# Patient Record
Sex: Female | Born: 1945 | Race: White | Hispanic: No | Marital: Married | Smoking: Never smoker
Health system: Southern US, Community
[De-identification: ages and names within clinical notes are randomized; demographics above are authoritative.]

## PROBLEM LIST (undated history)

## (undated) DIAGNOSIS — M1990 Unspecified osteoarthritis, unspecified site: Secondary | ICD-10-CM

## (undated) DIAGNOSIS — R51 Headache: Secondary | ICD-10-CM

## (undated) DIAGNOSIS — R519 Headache, unspecified: Secondary | ICD-10-CM

## (undated) DIAGNOSIS — F419 Anxiety disorder, unspecified: Secondary | ICD-10-CM

## (undated) DIAGNOSIS — L719 Rosacea, unspecified: Secondary | ICD-10-CM

## (undated) DIAGNOSIS — K219 Gastro-esophageal reflux disease without esophagitis: Secondary | ICD-10-CM

## (undated) HISTORY — PX: CATARACT EXTRACTION, BILATERAL: SHX1313

## (undated) HISTORY — PX: DILATION AND CURETTAGE OF UTERUS: SHX78

## (undated) HISTORY — PX: CHOLECYSTECTOMY: SHX55

## (undated) HISTORY — PX: CARPAL TUNNEL RELEASE: SHX101

## (undated) HISTORY — PX: EYE SURGERY: SHX253

## (undated) HISTORY — PX: FOOT SURGERY: SHX648

## (undated) HISTORY — PX: ABDOMINAL HYSTERECTOMY: SHX81

---

## 2018-05-17 ENCOUNTER — Other Ambulatory Visit: Payer: Self-pay | Admitting: Orthopedic Surgery

## 2018-05-17 DIAGNOSIS — M545 Low back pain, unspecified: Secondary | ICD-10-CM

## 2018-05-30 ENCOUNTER — Other Ambulatory Visit: Payer: Self-pay

## 2018-06-03 ENCOUNTER — Ambulatory Visit
Admission: RE | Admit: 2018-06-03 | Discharge: 2018-06-03 | Disposition: A | Discharge status: Home / Self Care | Payer: Medicare Other | Source: Ambulatory Visit | Attending: Orthopedic Surgery | Admitting: Orthopedic Surgery

## 2018-06-03 DIAGNOSIS — M545 Low back pain, unspecified: Secondary | ICD-10-CM

## 2018-06-14 DIAGNOSIS — E785 Hyperlipidemia, unspecified: Secondary | ICD-10-CM | POA: Diagnosis present

## 2018-06-14 DIAGNOSIS — M1711 Unilateral primary osteoarthritis, right knee: Secondary | ICD-10-CM | POA: Diagnosis present

## 2018-06-14 DIAGNOSIS — F411 Generalized anxiety disorder: Secondary | ICD-10-CM | POA: Diagnosis present

## 2018-06-14 NOTE — H&P (Signed)
KNEE ARTHROPLASTY ADMISSION H&P  Patient ID: Alicia Reyes MRN: [id number] DOB/AGE: 06-23-1945 73 y.o.  Chief Complaint: right knee pain.  Planned Procedure Date: 06/25/18 Medical and Cardiac Clearance by Zollie Peeon Bulla, PA-C    HPI: Alicia BaptistJohnnie Turan is a 73 y.o. female with a history of anxiety, hyperlipidemia, and UTI after 2 relatively recent surgical procedures who presents for evaluation of OA RIGHT KNEE. The patient has a history of pain and functional disability in the right knee due to arthritis and has failed non-surgical conservative treatments for greater than 12 weeks to include NSAID's and/or analgesics, corticosteriod injections, use of assistive devices and activity modification.  Onset of symptoms was gradual, starting about 6 months ago with gradually worsening course since that time.  Patient currently rates pain at 8 out of 10 with activity. Patient has night pain, worsening of pain with activity and weight bearing and pain that interferes with activities of daily living.  Patient has periarticular osteophytes and joint space narrowing on XR.  MRI shows severe medial OA and meniscus tear.  No significant lateral disease.  There is no active infection.  Past Medical History: Anxiety, Hyperlipidemia  Past Surgical History: Cholecystectomy 11/2017.  Neuroma right foot and bone spur left foot 2007.  Retina surgery 01/2017.  Cataract surgery 2014.  Carpal tunnel surgery 1985.  Hysterectomy 1982.  Allergies: Codeine: itching, hallucination w/ high doses Percocet: Sensitive - caused unsteadiness  Latex: itching / rash  Medications: Rosuvastatin 5 mg 3x per week Lorazepam 1 mg PRN  Social History: Married Airline pilotaccountant.  Non-smoker no alcohol use.  Family History: Mother with heart disease, MI, hypertension, CVA, cancer.  Father with DM.  Sister with heart disease, MI, hypertension, cancer.  ROS: Currently denies lightheadedness, dizziness, Fever, chills, CP, SOB.   No  personal history of DVT, PE, MI, or CVA. No loose teeth or dentures.  She wears glasses.  Intermittent trouble swallowing and a history of esophagitis/gastritis/ulcer after a dental procedure when she swallowed local anesthetic. All other systems have been reviewed and were otherwise currently negative with the exception of those mentioned in the HPI and as above.  Objective: Vitals: Ht: 5 feet 3 and half inches wt: 165 temp: 97.9 BP: 136/85 pulse: 79 O2 99 % on room air.   Physical Exam: General: Alert, NAD.  Antalgic Gait.  Uses a rolling walker. HEENT: EOMI, Good Neck Extension  Pulm: No increased work of breathing.  Clear B/L A/P w/o crackle or wheeze.  CV: RRR, No m/g/r appreciated  GI: soft, NT, ND Neuro: Neuro without gross focal deficit.  Sensation intact distally Skin: No lesions in the area of chief complaint MSK/Surgical Site: Right knee w/o redness or effusion.  Medial JLT. ROM 0-115.  5/5 strength in extension and flexion.  +EHL/FHL.  NVI.  Stable varus and valgus stress.    Imaging Review Plain radiographs and MRI demonstrate severe degenerative joint disease and medial meniscus tear of the right knee.   Preoperative templating of the joint replacement has been completed, documented, and submitted to the Operating Room personnel in order to optimize intra-operative equipment management.  Assessment: OA RIGHT KNEE Principal Problem:   Primary osteoarthritis of right knee Active Problems:   Anxiety state   Hyperlipidemia   Plan: Plan for Procedure(s): UNICOMPARTMENTAL KNEE  The patient history, physical exam, clinical judgement of the provider and imaging are consistent with end stage degenerative joint disease and unicompartmental joint arthroplasty is deemed medically necessary. The treatment options including medical management, injection therapy, and  arthroplasty were discussed at length. The risks and benefits of Procedure(s): UNICOMPARTMENTAL KNEE were presented  and reviewed.  The risks of nonoperative treatment, versus surgical intervention including but not limited to continued pain, aseptic loosening, stiffness, dislocation/subluxation, infection, bleeding, nerve injury, blood clots, cardiopulmonary complications, morbidity, mortality, among others were discussed. The patient verbalizes understanding and wishes to proceed with the plan.  Patient is being admitted for inpatient treatment for surgery, pain control, PT, OT, prophylactic antibiotics, VTE prophylaxis, progressive ambulation, ADL's and discharge planning.   Dental prophylaxis discussed and recommended for 2 years postoperatively.   The patient does meet the criteria for TXA which will be used perioperatively.    ASA 81 mg BID will be used postoperatively for DVT prophylaxis in addition to SCDs, and early ambulation.  Plan for Norco and Gabapentin for pain.  Celebrex and omeprazole for pain/inflammation and gastric protection.  She has minimal anterolisthesis of L5 on S1 secondary to Bilateral L5 pars defects and mild broad based disc bulge L2-S1 on MRI - currently asymptomatic.  Chronic great toe pain w/ previous OA dx.  No gout history, but will check Uric acid dt recent pain.  The patient is planning to be discharged with OPPT in care of her husband, sister-in-law, and her husband.   Patient's anticipated LOS is less than 2 midnights, meeting these requirements: - Lives within 1 hour of care - Has a competent adult at home to recover with post-op recover - NO history of  - Chronic pain requiring opiods  - Diabetes  - Coronary Artery Disease  - Heart failure  - Heart attack  - Stroke  - DVT/VTE  - Cardiac arrhythmia  - Respiratory Failure/COPD  - Renal failure  - Anemia  - Advanced Liver disease   Albina Billet III, PA-C 06/14/2018 2:27 PM

## 2018-06-18 ENCOUNTER — Encounter (HOSPITAL_COMMUNITY): Payer: Self-pay

## 2018-06-18 ENCOUNTER — Other Ambulatory Visit: Payer: Self-pay

## 2018-06-18 ENCOUNTER — Encounter (INDEPENDENT_AMBULATORY_CARE_PROVIDER_SITE_OTHER): Payer: Self-pay

## 2018-06-18 ENCOUNTER — Encounter (HOSPITAL_COMMUNITY)
Admission: RE | Admit: 2018-06-18 | Discharge: 2018-06-18 | Disposition: A | Discharge status: Home / Self Care | Payer: Medicare Other | Source: Ambulatory Visit | Attending: Orthopedic Surgery | Admitting: Orthopedic Surgery

## 2018-06-18 DIAGNOSIS — E785 Hyperlipidemia, unspecified: Secondary | ICD-10-CM | POA: Diagnosis not present

## 2018-06-18 DIAGNOSIS — F419 Anxiety disorder, unspecified: Secondary | ICD-10-CM | POA: Insufficient documentation

## 2018-06-18 DIAGNOSIS — Z01812 Encounter for preprocedural laboratory examination: Secondary | ICD-10-CM | POA: Diagnosis not present

## 2018-06-18 DIAGNOSIS — M1711 Unilateral primary osteoarthritis, right knee: Secondary | ICD-10-CM | POA: Insufficient documentation

## 2018-06-18 HISTORY — DX: Rosacea, unspecified: L71.9

## 2018-06-18 HISTORY — DX: Anxiety disorder, unspecified: F41.9

## 2018-06-18 HISTORY — DX: Gastro-esophageal reflux disease without esophagitis: K21.9

## 2018-06-18 HISTORY — DX: Headache, unspecified: R51.9

## 2018-06-18 HISTORY — DX: Unspecified osteoarthritis, unspecified site: M19.90

## 2018-06-18 HISTORY — DX: Headache: R51

## 2018-06-18 LAB — CBC
HCT: 42.5 % (ref 36.0–46.0)
HEMOGLOBIN: 13.5 g/dL (ref 12.0–15.0)
MCH: 29.6 pg (ref 26.0–34.0)
MCHC: 31.8 g/dL (ref 30.0–36.0)
MCV: 93.2 fL (ref 80.0–100.0)
Platelets: 260 10*3/uL (ref 150–400)
RBC: 4.56 MIL/uL (ref 3.87–5.11)
RDW: 13.5 % (ref 11.5–15.5)
WBC: 4.3 10*3/uL (ref 4.0–10.5)
nRBC: 0 % (ref 0.0–0.2)

## 2018-06-18 LAB — URINALYSIS, ROUTINE W REFLEX MICROSCOPIC
Bilirubin Urine: NEGATIVE
Glucose, UA: NEGATIVE mg/dL
Hgb urine dipstick: NEGATIVE
Ketones, ur: 20 mg/dL — AB
Nitrite: NEGATIVE
Protein, ur: NEGATIVE mg/dL
Specific Gravity, Urine: 1.016 (ref 1.005–1.030)
pH: 5 (ref 5.0–8.0)

## 2018-06-18 LAB — SURGICAL PCR SCREEN
MRSA, PCR: POSITIVE — AB
Staphylococcus aureus: POSITIVE — AB

## 2018-06-18 LAB — BASIC METABOLIC PANEL
Anion gap: 10 (ref 5–15)
BUN: 10 mg/dL (ref 8–23)
CO2: 27 mmol/L (ref 22–32)
Calcium: 9.6 mg/dL (ref 8.9–10.3)
Chloride: 106 mmol/L (ref 98–111)
Creatinine, Ser: 0.6 mg/dL (ref 0.44–1.00)
GFR calc Af Amer: >60 mL/min (ref >60)
GFR calc non Af Amer: >60 mL/min (ref >60)
Glucose, Bld: 98 mg/dL (ref 70–99)
Potassium: 4.5 mmol/L (ref 3.5–5.1)
SODIUM: 143 mmol/L (ref 135–145)

## 2018-06-18 LAB — URIC ACID: Uric Acid, Serum: 4.3 mg/dL (ref 2.5–7.1)

## 2018-06-18 NOTE — Patient Instructions (Addendum)
Alicia Reyes  06/18/2018   Your procedure is scheduled on: Tuesday 06/25/2018  Report to [facility] Main  Entrance              Report to admitting at   1010 AM    Call this number if you have problems the morning of surgery [phone]    Remember: Do not eat food or drink liquids :After Midnight.               BRUSH YOUR TEETH MORNING OF SURGERY AND RINSE YOUR MOUTH OUT, NO CHEWING GUM CANDY OR MINTS.     Take these medicines the morning of surgery with A SIP OF WATER: Zyrtec if needed,  use Eye drops                                You may not have any metal on your body including hair pins and              piercings  Do not wear jewelry, make-up, lotions, powders or perfumes, deodorant                         Men may shave face and neck.   Do not bring valuables to the hospital. [facility] IS NOT             RESPONSIBLE   FOR VALUABLES.  Contacts, dentures or bridgework may not be worn into surgery.  Leave suitcase in the car. After surgery it may be brought to your room.                  Please read over the following fact sheets you were given: _____________________________________________________________________             [facility] - Preparing for Surgery Before surgery, you can play an important role.  Because skin is not sterile, your skin needs to be as free of germs as possible.  You can reduce the number of germs on your skin by washing with CHG (chlorahexidine gluconate) soap before surgery.  CHG is an antiseptic cleaner which kills germs and bonds with the skin to continue killing germs even after washing. Please DO NOT use if you have an allergy to CHG or antibacterial soaps.  If your skin becomes reddened/irritated stop using the CHG and inform your nurse when you arrive at Short Stay. Do not shave (including legs and underarms) for at least 48 hours prior to the first CHG shower.  You may shave your face/neck. Please follow  these instructions carefully:  1.  Shower with CHG Soap the night before surgery and the  morning of Surgery.  2.  If you choose to wash your hair, wash your hair first as usual with your  normal  shampoo.  3.  After you shampoo, rinse your hair and body thoroughly to remove the  shampoo.                           4.  Use CHG as you would any other liquid soap.  You can apply chg directly  to the skin and wash                       Gently with a  scrungie or clean washcloth.  5.  Apply the CHG Soap to your body ONLY FROM THE NECK DOWN.   Do not use on face/ open                           Wound or open sores. Avoid contact with eyes, ears mouth and genitals (private parts).                       Wash face,  Genitals (private parts) with your normal soap.             6.  Wash thoroughly, paying special attention to the area where your surgery  will be performed.  7.  Thoroughly rinse your body with warm water from the neck down.  8.  DO NOT shower/wash with your normal soap after using and rinsing off  the CHG Soap.                9.  Pat yourself dry with a clean towel.            10.  Wear clean pajamas.            11.  Place clean sheets on your bed the night of your first shower and do not  sleep with pets. Day of Surgery : Do not apply any lotions/deodorants the morning of surgery.  Please wear clean clothes to the hospital/surgery center.  FAILURE TO FOLLOW THESE INSTRUCTIONS MAY RESULT IN THE CANCELLATION OF YOUR SURGERY PATIENT SIGNATURE_________________________________  NURSE SIGNATURE__________________________________  ________________________________________________________________________   Adam Phenix  An incentive spirometer is a tool that can help keep your lungs clear and active. This tool measures how well you are filling your lungs with each breath. Taking long deep breaths may help reverse or decrease the chance of developing breathing (pulmonary) problems  (especially infection) following:  A long period of time when you are unable to move or be active. BEFORE THE PROCEDURE   If the spirometer includes an indicator to show your best effort, your nurse or respiratory therapist will set it to a desired goal.  If possible, sit up straight or lean slightly forward. Try not to slouch.  Hold the incentive spirometer in an upright position. INSTRUCTIONS FOR USE  1. Sit on the edge of your bed if possible, or sit up as far as you can in bed or on a chair. 2. Hold the incentive spirometer in an upright position. 3. Breathe out normally. 4. Place the mouthpiece in your mouth and seal your lips tightly around it. 5. Breathe in slowly and as deeply as possible, raising the piston or the ball toward the top of the column. 6. Hold your breath for 3-5 seconds or for as long as possible. Allow the piston or ball to fall to the bottom of the column. 7. Remove the mouthpiece from your mouth and breathe out normally. 8. Rest for a few seconds and repeat Steps 1 through 7 at least 10 times every 1-2 hours when you are awake. Take your time and take a few normal breaths between deep breaths. 9. The spirometer may include an indicator to show your best effort. Use the indicator as a goal to work toward during each repetition. 10. After each set of 10 deep breaths, practice coughing to be sure your lungs are clear. If you have an incision (the cut made at the time of surgery), support your incision  when coughing by placing a pillow or rolled up towels firmly against it. Once you are able to get out of bed, walk around indoors and cough well. You may stop using the incentive spirometer when instructed by your caregiver.  RISKS AND COMPLICATIONS  Take your time so you do not get dizzy or light-headed.  If you are in pain, you may need to take or ask for pain medication before doing incentive spirometry. It is harder to take a deep breath if you are having  pain. AFTER USE  Rest and breathe slowly and easily.  It can be helpful to keep track of a log of your progress. Your caregiver can provide you with a simple table to help with this. If you are using the spirometer at home, follow these instructions: [place] IF:   You are having difficultly using the spirometer.  You have trouble using the spirometer as often as instructed.  Your pain medication is not giving enough relief while using the spirometer.  You develop fever of 100.5 F (38.1 C) or higher. SEEK IMMEDIATE MEDICAL CARE IF:   You cough up bloody sputum that had not been present before.  You develop fever of 102 F (38.9 C) or greater.  You develop worsening pain at or near the incision site. MAKE SURE YOU:   Understand these instructions.  Will watch your condition.  Will get help right away if you are not doing well or get worse. Document Released: 10/09/2006 Document Revised: 08/21/2011 Document Reviewed: 12/10/2006 [facility] Patient Information 2014 [place], [place].   ________________________________________________________________________

## 2018-06-21 ENCOUNTER — Other Ambulatory Visit: Payer: Self-pay | Admitting: Orthopedic Surgery

## 2018-06-21 NOTE — Care Plan (Signed)
Multiple calls with patient prior to surgery. She plans to discharge to home with family and go to OPPT. It has been arranged at [facility] OPPT on [address] at her request.  Appointment time is 06/28/18 @ 1000 Equipment ordered from [employer].   Shauna Hugh, RNCM  [phone]

## 2018-06-24 MED ORDER — BUPIVACAINE LIPOSOME 1.3 % IJ SUSP
20.0000 mL | INTRAMUSCULAR | Status: AC
  Filled 2018-06-24: qty 20

## 2018-06-25 ENCOUNTER — Inpatient Hospital Stay (HOSPITAL_COMMUNITY): Payer: Medicare Other | Admitting: Physician Assistant

## 2018-06-25 ENCOUNTER — Encounter (HOSPITAL_COMMUNITY): Payer: Self-pay | Admitting: Anesthesiology

## 2018-06-25 ENCOUNTER — Encounter (HOSPITAL_COMMUNITY)
Admission: RE | Disposition: A | Discharge status: Home Health | Payer: Self-pay | Source: Home / Self Care | Attending: Orthopedic Surgery

## 2018-06-25 ENCOUNTER — Other Ambulatory Visit: Payer: Self-pay

## 2018-06-25 ENCOUNTER — Observation Stay (HOSPITAL_COMMUNITY)
Admission: RE | Admit: 2018-06-25 | Discharge: 2018-06-26 | Disposition: A | Discharge status: Home Health | Payer: Medicare Other | Attending: Orthopedic Surgery | Admitting: Orthopedic Surgery

## 2018-06-25 ENCOUNTER — Observation Stay (HOSPITAL_COMMUNITY): Payer: Medicare Other

## 2018-06-25 ENCOUNTER — Inpatient Hospital Stay (HOSPITAL_COMMUNITY): Payer: Medicare Other | Admitting: Certified Registered Nurse Anesthetist

## 2018-06-25 DIAGNOSIS — M1711 Unilateral primary osteoarthritis, right knee: Secondary | ICD-10-CM | POA: Diagnosis not present

## 2018-06-25 DIAGNOSIS — K219 Gastro-esophageal reflux disease without esophagitis: Secondary | ICD-10-CM | POA: Insufficient documentation

## 2018-06-25 DIAGNOSIS — E785 Hyperlipidemia, unspecified: Secondary | ICD-10-CM | POA: Diagnosis not present

## 2018-06-25 DIAGNOSIS — Z7984 Long term (current) use of oral hypoglycemic drugs: Secondary | ICD-10-CM | POA: Diagnosis not present

## 2018-06-25 DIAGNOSIS — I2510 Atherosclerotic heart disease of native coronary artery without angina pectoris: Secondary | ICD-10-CM | POA: Insufficient documentation

## 2018-06-25 DIAGNOSIS — M1710 Unilateral primary osteoarthritis, unspecified knee: Secondary | ICD-10-CM | POA: Diagnosis present

## 2018-06-25 DIAGNOSIS — D649 Anemia, unspecified: Secondary | ICD-10-CM | POA: Diagnosis not present

## 2018-06-25 DIAGNOSIS — Z96659 Presence of unspecified artificial knee joint: Secondary | ICD-10-CM

## 2018-06-25 DIAGNOSIS — I509 Heart failure, unspecified: Secondary | ICD-10-CM | POA: Diagnosis not present

## 2018-06-25 DIAGNOSIS — J449 Chronic obstructive pulmonary disease, unspecified: Secondary | ICD-10-CM | POA: Diagnosis not present

## 2018-06-25 DIAGNOSIS — Z885 Allergy status to narcotic agent status: Secondary | ICD-10-CM | POA: Insufficient documentation

## 2018-06-25 DIAGNOSIS — E119 Type 2 diabetes mellitus without complications: Secondary | ICD-10-CM | POA: Insufficient documentation

## 2018-06-25 DIAGNOSIS — Z79899 Other long term (current) drug therapy: Secondary | ICD-10-CM | POA: Diagnosis not present

## 2018-06-25 DIAGNOSIS — F411 Generalized anxiety disorder: Secondary | ICD-10-CM | POA: Diagnosis present

## 2018-06-25 HISTORY — PX: PARTIAL KNEE ARTHROPLASTY: SHX2174

## 2018-06-25 SURGERY — ARTHROPLASTY, KNEE, UNICOMPARTMENTAL
Anesthesia: Spinal | Site: Knee | Laterality: Right

## 2018-06-25 MED ORDER — LIDOCAINE 2% (20 MG/ML) 5 ML SYRINGE
INTRAMUSCULAR | Status: DC | PRN
  Administered 2018-06-25: 40 mg via INTRAVENOUS

## 2018-06-25 MED ORDER — POLYVINYL ALCOHOL 1.4 % OP SOLN
1.0000 [drp] | Freq: Every day | OPHTHALMIC | Status: DC | PRN
  Filled 2018-06-25: qty 15

## 2018-06-25 MED ORDER — PROPOFOL 500 MG/50ML IV EMUL
INTRAVENOUS | Status: DC | PRN
  Administered 2018-06-25: 75 ug/kg/min via INTRAVENOUS

## 2018-06-25 MED ORDER — ONDANSETRON HCL 4 MG/2ML IJ SOLN
INTRAMUSCULAR | Status: DC | PRN
  Administered 2018-06-25: 4 mg via INTRAVENOUS

## 2018-06-25 MED ORDER — SORBITOL 70 % SOLN
30.0000 mL | Freq: Every day | Status: DC | PRN
  Filled 2018-06-25: qty 30

## 2018-06-25 MED ORDER — FENTANYL CITRATE (PF) 100 MCG/2ML IJ SOLN: 25.0000 ug | INTRAMUSCULAR | Status: DC | PRN

## 2018-06-25 MED ORDER — PANTOPRAZOLE SODIUM 40 MG PO TBEC
40.0000 mg | DELAYED_RELEASE_TABLET | Freq: Every day | ORAL | Status: DC
  Administered 2018-06-25 – 2018-06-26 (×2): 40 mg via ORAL
  Filled 2018-06-25 (×2): qty 1

## 2018-06-25 MED ORDER — OXYCODONE HCL 5 MG PO TABS: 5.0000 mg | ORAL_TABLET | Freq: Once | ORAL | Status: DC | PRN

## 2018-06-25 MED ORDER — POVIDONE-IODINE 10 % EX SWAB
2.0000 "application " | Freq: Once | CUTANEOUS | Status: AC
  Administered 2018-06-25: 2 via TOPICAL

## 2018-06-25 MED ORDER — PROPOFOL 10 MG/ML IV BOLUS
INTRAVENOUS | Status: DC | PRN
  Administered 2018-06-25: 10 mg via INTRAVENOUS

## 2018-06-25 MED ORDER — MENTHOL 3 MG MT LOZG: 1.0000 | LOZENGE | OROMUCOSAL | Status: DC | PRN

## 2018-06-25 MED ORDER — FENTANYL CITRATE (PF) 100 MCG/2ML IJ SOLN
50.0000 ug | Freq: Once | INTRAMUSCULAR | Status: DC
  Filled 2018-06-25: qty 2

## 2018-06-25 MED ORDER — MAGNESIUM CITRATE PO SOLN: 1.0000 | Freq: Once | ORAL | Status: DC | PRN

## 2018-06-25 MED ORDER — METHOCARBAMOL 500 MG PO TABS: 500.0000 mg | ORAL_TABLET | Freq: Four times a day (QID) | ORAL | Status: DC | PRN

## 2018-06-25 MED ORDER — ACETAMINOPHEN 325 MG PO TABS
325.0000 mg | ORAL_TABLET | Freq: Four times a day (QID) | ORAL | Status: DC | PRN
  Filled 2018-06-25: qty 2

## 2018-06-25 MED ORDER — PROPOFOL 10 MG/ML IV BOLUS
INTRAVENOUS | Status: AC
  Filled 2018-06-25: qty 20

## 2018-06-25 MED ORDER — METOCLOPRAMIDE HCL 5 MG/ML IJ SOLN: 5.0000 mg | Freq: Three times a day (TID) | INTRAMUSCULAR | Status: DC | PRN

## 2018-06-25 MED ORDER — ACETAMINOPHEN 10 MG/ML IV SOLN: 1000.0000 mg | Freq: Once | INTRAVENOUS | Status: DC | PRN

## 2018-06-25 MED ORDER — CEFAZOLIN SODIUM-DEXTROSE 2-4 GM/100ML-% IV SOLN
2.0000 g | INTRAVENOUS | Status: AC
  Administered 2018-06-25: 2 g via INTRAVENOUS
  Filled 2018-06-25: qty 100

## 2018-06-25 MED ORDER — MIDAZOLAM HCL 2 MG/2ML IJ SOLN
1.0000 mg | Freq: Once | INTRAMUSCULAR | Status: DC
  Filled 2018-06-25: qty 2

## 2018-06-25 MED ORDER — CELECOXIB 200 MG PO CAPS
200.0000 mg | ORAL_CAPSULE | Freq: Two times a day (BID) | ORAL | Status: DC
  Administered 2018-06-25 – 2018-06-26 (×2): 200 mg via ORAL
  Filled 2018-06-25 (×2): qty 1

## 2018-06-25 MED ORDER — HYDROCODONE-ACETAMINOPHEN 5-325 MG PO TABS
1.0000 | ORAL_TABLET | ORAL | Status: DC | PRN
  Administered 2018-06-25 – 2018-06-26 (×2): 1 via ORAL
  Filled 2018-06-25 (×2): qty 1
  Filled 2018-06-25: qty 2

## 2018-06-25 MED ORDER — MEPERIDINE HCL 50 MG/ML IJ SOLN
INTRAMUSCULAR | Status: AC
  Filled 2018-06-25: qty 1

## 2018-06-25 MED ORDER — SODIUM CHLORIDE 0.9 % IV SOLN
INTRAVENOUS | Status: DC | PRN
  Administered 2018-06-25: 40 ug/min via INTRAVENOUS

## 2018-06-25 MED ORDER — OXYCODONE HCL 5 MG/5ML PO SOLN
5.0000 mg | Freq: Once | ORAL | Status: DC | PRN
  Filled 2018-06-25: qty 5

## 2018-06-25 MED ORDER — VANCOMYCIN HCL IN DEXTROSE 1-5 GM/200ML-% IV SOLN
1000.0000 mg | Freq: Once | INTRAVENOUS | Status: AC
  Administered 2018-06-25: 1000 mg via INTRAVENOUS
  Filled 2018-06-25: qty 200

## 2018-06-25 MED ORDER — LORATADINE 10 MG PO TABS
10.0000 mg | ORAL_TABLET | Freq: Every day | ORAL | Status: DC
  Administered 2018-06-26: 10 mg via ORAL
  Filled 2018-06-25: qty 1

## 2018-06-25 MED ORDER — ONDANSETRON HCL 4 MG PO TABS: 4.0000 mg | ORAL_TABLET | Freq: Four times a day (QID) | ORAL | Status: DC | PRN

## 2018-06-25 MED ORDER — ASPIRIN EC 81 MG PO TBEC: 81.0000 mg | DELAYED_RELEASE_TABLET | Freq: Two times a day (BID) | ORAL | 0 refills | Status: DC

## 2018-06-25 MED ORDER — HYDROCODONE-ACETAMINOPHEN 5-325 MG PO TABS: 1.0000 | ORAL_TABLET | Freq: Four times a day (QID) | ORAL | 0 refills | Status: AC | PRN

## 2018-06-25 MED ORDER — OMEPRAZOLE 20 MG PO CPDR: 20.0000 mg | DELAYED_RELEASE_CAPSULE | Freq: Every day | ORAL | 0 refills | Status: AC

## 2018-06-25 MED ORDER — ONDANSETRON HCL 4 MG PO TABS: 4.0000 mg | ORAL_TABLET | Freq: Three times a day (TID) | ORAL | 0 refills | Status: AC | PRN

## 2018-06-25 MED ORDER — PHENOL 1.4 % MT LIQD
1.0000 | OROMUCOSAL | Status: DC | PRN
  Filled 2018-06-25: qty 177

## 2018-06-25 MED ORDER — PHENYLEPHRINE HCL 10 MG/ML IJ SOLN
INTRAMUSCULAR | Status: AC
  Filled 2018-06-25: qty 1

## 2018-06-25 MED ORDER — DOCUSATE SODIUM 100 MG PO CAPS
100.0000 mg | ORAL_CAPSULE | Freq: Two times a day (BID) | ORAL | Status: DC
  Administered 2018-06-25 – 2018-06-26 (×2): 100 mg via ORAL
  Filled 2018-06-25 (×2): qty 1

## 2018-06-25 MED ORDER — METOCLOPRAMIDE HCL 5 MG PO TABS: 5.0000 mg | ORAL_TABLET | Freq: Three times a day (TID) | ORAL | Status: DC | PRN

## 2018-06-25 MED ORDER — CHLORHEXIDINE GLUCONATE 4 % EX LIQD: 60.0000 mL | Freq: Once | CUTANEOUS | Status: DC

## 2018-06-25 MED ORDER — DEXAMETHASONE SODIUM PHOSPHATE 10 MG/ML IJ SOLN
10.0000 mg | Freq: Once | INTRAMUSCULAR | Status: AC
  Administered 2018-06-26: 10 mg via INTRAVENOUS
  Filled 2018-06-25: qty 1

## 2018-06-25 MED ORDER — BUPIVACAINE HCL (PF) 0.75 % IJ SOLN
INTRAMUSCULAR | Status: DC | PRN
  Administered 2018-06-25: 1.6 mL via INTRATHECAL

## 2018-06-25 MED ORDER — TRANEXAMIC ACID-NACL 1000-0.7 MG/100ML-% IV SOLN
1000.0000 mg | INTRAVENOUS | Status: AC
  Administered 2018-06-25: 1000 mg via INTRAVENOUS
  Filled 2018-06-25: qty 100

## 2018-06-25 MED ORDER — 0.9 % SODIUM CHLORIDE (POUR BTL) OPTIME
TOPICAL | Status: DC | PRN
  Administered 2018-06-25: 1000 mL

## 2018-06-25 MED ORDER — LORAZEPAM 1 MG PO TABS: 1.0000 mg | ORAL_TABLET | Freq: Every evening | ORAL | Status: DC | PRN

## 2018-06-25 MED ORDER — MORPHINE SULFATE (PF) 2 MG/ML IV SOLN: 0.5000 mg | INTRAVENOUS | Status: DC | PRN

## 2018-06-25 MED ORDER — PROPOFOL 10 MG/ML IV BOLUS
INTRAVENOUS | Status: AC
  Filled 2018-06-25: qty 60

## 2018-06-25 MED ORDER — LIDOCAINE 2% (20 MG/ML) 5 ML SYRINGE
INTRAMUSCULAR | Status: AC
  Filled 2018-06-25: qty 5

## 2018-06-25 MED ORDER — ONDANSETRON HCL 4 MG/2ML IJ SOLN: 4.0000 mg | Freq: Four times a day (QID) | INTRAMUSCULAR | Status: DC | PRN

## 2018-06-25 MED ORDER — ASPIRIN 81 MG PO CHEW
81.0000 mg | CHEWABLE_TABLET | Freq: Two times a day (BID) | ORAL | Status: DC
  Administered 2018-06-25 – 2018-06-26 (×2): 81 mg via ORAL
  Filled 2018-06-25 (×2): qty 1

## 2018-06-25 MED ORDER — ACETAMINOPHEN 500 MG PO TABS
1000.0000 mg | ORAL_TABLET | Freq: Once | ORAL | Status: AC
  Administered 2018-06-25: 1000 mg via ORAL
  Filled 2018-06-25: qty 2

## 2018-06-25 MED ORDER — GABAPENTIN 300 MG PO CAPS: 300.0000 mg | ORAL_CAPSULE | Freq: Two times a day (BID) | ORAL | 0 refills | Status: AC

## 2018-06-25 MED ORDER — METHOCARBAMOL 1000 MG/10ML IJ SOLN
500.0000 mg | Freq: Four times a day (QID) | INTRAVENOUS | Status: DC | PRN
  Filled 2018-06-25: qty 5

## 2018-06-25 MED ORDER — POLYETHYLENE GLYCOL 3350 17 G PO PACK: 17.0000 g | PACK | Freq: Every day | ORAL | Status: DC | PRN

## 2018-06-25 MED ORDER — HYDROCODONE-ACETAMINOPHEN 7.5-325 MG PO TABS: 1.0000 | ORAL_TABLET | ORAL | Status: DC | PRN

## 2018-06-25 MED ORDER — DIPHENHYDRAMINE HCL 12.5 MG/5ML PO ELIX: 12.5000 mg | ORAL_SOLUTION | ORAL | Status: DC | PRN

## 2018-06-25 MED ORDER — ONDANSETRON HCL 4 MG/2ML IJ SOLN
INTRAMUSCULAR | Status: AC
  Filled 2018-06-25: qty 2

## 2018-06-25 MED ORDER — GABAPENTIN 300 MG PO CAPS
300.0000 mg | ORAL_CAPSULE | Freq: Three times a day (TID) | ORAL | Status: DC
  Administered 2018-06-25 – 2018-06-26 (×3): 300 mg via ORAL
  Filled 2018-06-25 (×3): qty 1

## 2018-06-25 MED ORDER — LACTATED RINGERS IV SOLN
INTRAVENOUS | Status: DC
  Administered 2018-06-25: 1000 mL via INTRAVENOUS
  Administered 2018-06-25: 13:00:00 via INTRAVENOUS

## 2018-06-25 MED ORDER — PHENYLEPHRINE 40 MCG/ML (10ML) SYRINGE FOR IV PUSH (FOR BLOOD PRESSURE SUPPORT)
PREFILLED_SYRINGE | INTRAVENOUS | Status: DC | PRN
  Administered 2018-06-25: 80 ug via INTRAVENOUS

## 2018-06-25 MED ORDER — CELECOXIB 200 MG PO CAPS: 200.0000 mg | ORAL_CAPSULE | Freq: Two times a day (BID) | ORAL | 0 refills | Status: AC

## 2018-06-25 MED ORDER — SODIUM CHLORIDE 0.9 % IV SOLN
INTRAVENOUS | Status: DC | PRN
  Administered 2018-06-25: 50 mL

## 2018-06-25 MED ORDER — STERILE WATER FOR IRRIGATION IR SOLN
Status: DC | PRN
  Administered 2018-06-25: 2000 mL

## 2018-06-25 MED ORDER — POLYVINYL ALCOHOL-POVIDONE PF 1.4-0.6 % OP SOLN: 1.0000 [drp] | Freq: Every day | OPHTHALMIC | Status: DC | PRN

## 2018-06-25 MED ORDER — SODIUM CHLORIDE 0.9 % IR SOLN
Status: DC | PRN
  Administered 2018-06-25: 1000 mL

## 2018-06-25 MED ORDER — CEFAZOLIN SODIUM-DEXTROSE 2-4 GM/100ML-% IV SOLN
2.0000 g | Freq: Four times a day (QID) | INTRAVENOUS | Status: AC
  Administered 2018-06-25 – 2018-06-26 (×2): 2 g via INTRAVENOUS
  Filled 2018-06-25 (×2): qty 100

## 2018-06-25 MED ORDER — METHOCARBAMOL 500 MG PO TABS: 500.0000 mg | ORAL_TABLET | Freq: Three times a day (TID) | ORAL | 0 refills | Status: DC | PRN

## 2018-06-25 MED ORDER — MEPERIDINE HCL 50 MG/ML IJ SOLN
6.2500 mg | INTRAMUSCULAR | Status: DC | PRN
  Administered 2018-06-25: 12.5 mg via INTRAVENOUS

## 2018-06-25 MED ORDER — BUPIVACAINE-EPINEPHRINE (PF) 0.5% -1:200000 IJ SOLN
INTRAMUSCULAR | Status: DC | PRN
  Administered 2018-06-25: 20 mL via PERINEURAL

## 2018-06-25 MED ORDER — PROMETHAZINE HCL 25 MG/ML IJ SOLN: 6.2500 mg | INTRAMUSCULAR | Status: DC | PRN

## 2018-06-25 MED ORDER — LACTATED RINGERS IV SOLN
INTRAVENOUS | Status: DC
  Administered 2018-06-25 – 2018-06-26 (×2): via INTRAVENOUS

## 2018-06-25 MED ORDER — SODIUM CHLORIDE (PF) 0.9 % IJ SOLN
INTRAMUSCULAR | Status: AC
  Filled 2018-06-25: qty 50

## 2018-06-25 MED ORDER — DOCUSATE SODIUM 100 MG PO CAPS: 100.0000 mg | ORAL_CAPSULE | Freq: Two times a day (BID) | ORAL | 0 refills | Status: AC

## 2018-06-25 SURGICAL SUPPLY — 48 items
BEARING TIB MENISCAL RT SM 4MM (Joint) IMPLANT
BLADE SURG 15 STRL LF C SS BP (BLADE) ×1 IMPLANT
BLADE SURG 15 STRL SS (BLADE) ×2
BNDG ELASTIC 6X10 VLCR STRL LF (GAUZE/BANDAGES/DRESSINGS) ×3 IMPLANT
BOWL SMART MIX CTS (DISPOSABLE) ×3 IMPLANT
CATH SILICONE 14FRX5CC (CATHETERS) ×2 IMPLANT
CEMENT BONE R 1X40 (Cement) ×2 IMPLANT
CHLORAPREP W/TINT 26ML (MISCELLANEOUS) ×3 IMPLANT
CLOSURE STERI-STRIP 1/2X4 (GAUZE/BANDAGES/DRESSINGS) ×1
CLSR STERI-STRIP ANTIMIC 1/2X4 (GAUZE/BANDAGES/DRESSINGS) ×2 IMPLANT
COVER SURGICAL LIGHT HANDLE (MISCELLANEOUS) ×3 IMPLANT
COVER WAND RF STERILE (DRAPES) ×3 IMPLANT
CUFF TOURN SGL QUICK 34 (TOURNIQUET CUFF) ×2
CUFF TRNQT CYL 34X4X40X1 (TOURNIQUET CUFF) ×1 IMPLANT
DRAPE ARTHROSCOPY W/POUCH 114 (DRAPES) ×3 IMPLANT
DRAPE U-SHAPE 47X51 STRL (DRAPES) ×3 IMPLANT
DRSG MEPILEX BORDER 4X8 (GAUZE/BANDAGES/DRESSINGS) ×3 IMPLANT
ELECT REM PT RETURN 15FT ADLT (MISCELLANEOUS) ×3 IMPLANT
FORCEPS GRASP 3 PRONG 3.2FR (CUTTING FORCEPS) ×3 IMPLANT
GLOVE BIOGEL PI IND STRL 8 (GLOVE) ×2 IMPLANT
GLOVE BIOGEL PI INDICATOR 8 (GLOVE) ×4
GOWN STRL REUS W/ TWL LRG LVL3 (GOWN DISPOSABLE) ×1 IMPLANT
GOWN STRL REUS W/ TWL XL LVL3 (GOWN DISPOSABLE) ×1 IMPLANT
GOWN STRL REUS W/TWL LRG LVL3 (GOWN DISPOSABLE) ×2
GOWN STRL REUS W/TWL XL LVL3 (GOWN DISPOSABLE) ×2
HANDPIECE INTERPULSE COAX TIP (DISPOSABLE) ×2
IMMOBILIZER KNEE 20 (SOFTGOODS) ×3
IMMOBILIZER KNEE 20 THIGH 36 (SOFTGOODS) ×1 IMPLANT
MANIFOLD NEPTUNE II (INSTRUMENTS) ×3 IMPLANT
NS IRRIG 1000ML POUR BTL (IV SOLUTION) ×3 IMPLANT
PACK BLADE SAW RECIP 70 3 PT (BLADE) ×2 IMPLANT
PACK ICE MAXI GEL EZY WRAP (MISCELLANEOUS) ×3 IMPLANT
PACK TOTAL KNEE CUSTOM (KITS) ×3 IMPLANT
PEG FEMORAL PEGGED STRL SM (Knees) ×2 IMPLANT
PROTECTOR NERVE ULNAR (MISCELLANEOUS) ×3 IMPLANT
SET HNDPC FAN SPRY TIP SCT (DISPOSABLE) ×1 IMPLANT
SUCTION FRAZIER HANDLE 10FR (MISCELLANEOUS) ×2
SUCTION TUBE FRAZIER 10FR DISP (MISCELLANEOUS) ×1 IMPLANT
SUT MNCRL AB 4-0 PS2 18 (SUTURE) ×3 IMPLANT
SUT VIC AB 0 CT1 36 (SUTURE) ×3 IMPLANT
SUT VIC AB 1 CT1 36 (SUTURE) ×3 IMPLANT
SUT VIC AB 2-0 CT1 27 (SUTURE) ×2
SUT VIC AB 2-0 CT1 TAPERPNT 27 (SUTURE) ×1 IMPLANT
TRAY FOLEY BAG SILVER LF 16FR (CATHETERS) ×2 IMPLANT
TRAY TIBIA MEDIAL OXFORD SZ A (Joint) ×2 IMPLANT
UNICOMPARTMENTAL KNEE MENISCAL (Joint) ×3 IMPLANT
WRAP KNEE MAXI GEL POST OP (GAUZE/BANDAGES/DRESSINGS) ×2 IMPLANT
YANKAUER SUCT BULB TIP 10FT TU (MISCELLANEOUS) ×3 IMPLANT

## 2018-06-25 NOTE — Care Plan (Signed)
Ortho Bundle Case Management Note  Patient Details  Name: Alicia Reyes MRN: [id number] Date of Birth: 24-Apr-1946    Multiple conversations with patient prior to surgery. Her discharge plan has now changed to home with husband and HHPT. Referral to [facility] for 5 HHPT visits prior to going to OPPT. Her OPPT appointment has been rescheduled. Her equipment was delivered to her home prior to surgery.                DME Arranged:  Bedside commode, CPM DME Agency:  [employer]  HH Arranged:  PT HH Agency:  [facility]  Additional Comments: Please contact me with any questions of if this plan should need to change.  Shauna Hughenee Angiulli,  RN,BSN,MHA,CCM  [facility]  [phone] 06/25/2018, 9:35 AM

## 2018-06-25 NOTE — Evaluation (Signed)
Physical Therapy Evaluation Patient Details Name: Alicia Reyes MRN: [id number] DOB: 09-09-1945 Today's Date: 06/25/2018   History of Present Illness  73 yo female s/p R UKR on 06/25/18. 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   Clinical Impression   Pt presents with mild R knee pain, decreased R knee ROM, antalgic gait, and increased time and effort to perform mobility tasks. Pt to benefit from acute PT to address deficits. Pt ambulated 100 ft with RW with min guard assist, verbal cuing provided throughout. Pt educated on ankle pumps (20/hour) to perform this afternoon/evening to increase circulation, to pt's tolerance and limited by pain. PT to progress mobility as tolerated, and will continue to follow acutely.        Follow Up Recommendations Follow surgeon's recommendation for DC plan and follow-up therapies;Supervision for mobility/OOB    Equipment Recommendations  None recommended by PT    Recommendations for Other Services       Precautions / Restrictions Precautions Precautions: Fall Restrictions Weight Bearing Restrictions: No Other Position/Activity Restrictions: WBAT       Mobility  Bed Mobility Overal bed mobility: Needs Assistance Bed Mobility: Supine to Sit     Supine to sit: Min assist;HOB elevated     General bed mobility comments: Min assist for RLE management, increased time.   Transfers Overall transfer level: Needs assistance Equipment used: Rolling walker (2 wheeled) Transfers: Sit to/from Stand Sit to Stand: Min guard;From elevated surface         General transfer comment: Min guard for safety. Pt with proper hand placement when rising. Sit to stand x2, once from bed and once from toilet.   Ambulation/Gait Ambulation/Gait assistance: Min guard;+2 safety/equipment Gait Distance (Feet): 100 Feet Assistive device: Rolling walker (2 wheeled) Gait  Pattern/deviations: Step-to pattern;Step-through pattern;Decreased weight shift to right;Antalgic;Decreased stance time - right Gait velocity: decr    General Gait Details: Min guard for safety. Verbal cuing for sequencing with step-to gait although pt quickly progressing to step-through gait, placement in RW, turning. Pt with increasing antalgic gait with further ambulation distance.  Stairs            Wheelchair Mobility    Modified Rankin (Stroke Patients Only)       Balance Overall balance assessment: Mild deficits observed, not formally tested                                           Pertinent Vitals/Pain Pain Assessment: 0-10 Pain Score: 2  Pain Location: R knee  Pain Descriptors / Indicators: Aching Pain Intervention(s): Limited activity within patient's tolerance;Repositioned;Ice applied;Monitored during session;Premedicated before session    Home Living Family/patient expects to be discharged to:: Private residence Living Arrangements: Spouse/significant other Available Help at Discharge: Family Type of Home: House Home Access: Stairs to enter;Ramped entrance(Pt states her husband just built her a ramp with a handrail )   Entrance Stairs-Number of Steps: 3 Home Layout: One level Home Equipment: Walker - 2 wheels;Bedside commode      Prior Function Level of Independence: Independent with assistive device(s)         Comments: occasionally used RW for steadying and ambulation, especially in the mornings when her knee felt stiff     Hand Dominance   Dominant Hand: Right    Extremity/Trunk Assessment   Upper Extremity  Assessment Upper Extremity Assessment: Overall WFL for tasks assessed    Lower Extremity Assessment Lower Extremity Assessment: Overall WFL for tasks assessed;RLE deficits/detail RLE Deficits / Details: suspected post-surgical weakness; able to perform ankle pumps, quad set, SLR without lift assist RLE Sensation:  WNL    Cervical / Trunk Assessment Cervical / Trunk Assessment: Normal  Communication   Communication: No difficulties  Cognition Arousal/Alertness: Awake/alert Behavior During Therapy: WFL for tasks assessed/performed Overall Cognitive Status: Within Functional Limits for tasks assessed                                        General Comments      Exercises Total Joint Exercises Goniometric ROM: knee aarom ~5-90*, limited by pain    Assessment/Plan    PT Assessment Patient needs continued PT services  PT Problem List Decreased strength;Pain;Decreased range of motion;Decreased activity tolerance;Decreased knowledge of use of DME;Decreased balance;Decreased mobility       PT Treatment Interventions DME instruction;Therapeutic activities;Gait training;Therapeutic exercise;Patient/family education;Stair training;Balance training;Functional mobility training    PT Goals (Current goals can be found in the Care Plan section)  Acute Rehab PT Goals Patient Stated Goal: none stated  PT Goal Formulation: With patient Time For Goal Achievement: 07/02/18 Potential to Achieve Goals: Good    Frequency 7X/week   Barriers to discharge        Co-evaluation               AM-PAC PT "6 Clicks" Mobility  Outcome Measure Help needed turning from your back to your side while in a flat bed without using bedrails?: A Little Help needed moving from lying on your back to sitting on the side of a flat bed without using bedrails?: A Little Help needed moving to and from a bed to a chair (including a wheelchair)?: A Little Help needed standing up from a chair using your arms (e.g., wheelchair or bedside chair)?: A Little Help needed to walk in hospital room?: A Little Help needed climbing 3-5 steps with a railing? : A Little 6 Click Score: 18    End of Session Equipment Utilized During Treatment: Gait belt Activity Tolerance: Patient tolerated treatment well Patient  left: in chair;with chair alarm set;with call bell/phone within reach;with family/visitor present;with SCD's reapplied Nurse Communication: Mobility status;Other (comment)(Pt toileted) PT Visit Diagnosis: Other abnormalities of gait and mobility (R26.89);Difficulty in walking, not elsewhere classified (R26.2)    Time: 7353-2992 PT Time Calculation (min) (ACUTE ONLY): 30 min   Charges:   PT Evaluation $PT Eval Low Complexity: 1 Low PT Treatments $Gait Training: 8-22 mins        Nicola Police, PT Acute Rehabilitation Services Pager [phone]  Office [phone]   Tyrone Apple D Despina Hidden 06/25/2018, 6:31 PM

## 2018-06-25 NOTE — Anesthesia Preprocedure Evaluation (Addendum)
Anesthesia Evaluation  Patient identified by MRN, date of birth, ID band Patient awake    Reviewed: Allergy & Precautions, NPO status , Patient's Chart, lab work & pertinent test results  History of Anesthesia Complications Negative for: history of anesthetic complications  Airway Mallampati: II  TM Distance: >3 FB Neck ROM: Full    Dental  (+) Teeth Intact   Pulmonary neg pulmonary ROS,    Pulmonary exam normal breath sounds clear to auscultation       Cardiovascular negative cardio ROS Normal cardiovascular exam Rhythm:Regular Rate:Normal     Neuro/Psych  Headaches, Anxiety    GI/Hepatic Neg liver ROS, GERD  Controlled,  Endo/Other  negative endocrine ROS  Renal/GU negative Renal ROS     Musculoskeletal  (+) Arthritis , Osteoarthritis,    Abdominal   Peds  Hematology negative hematology ROS (+)   Anesthesia Other Findings Day of surgery medications reviewed with the patient.  Reproductive/Obstetrics                            Anesthesia Physical Anesthesia Plan  ASA: II  Anesthesia Plan: Spinal   Post-op Pain Management:  Regional for Post-op pain   Induction:   PONV Risk Score and Plan: 2 and Treatment may vary due to age or medical condition, Propofol infusion and Ondansetron  Airway Management Planned: Natural Airway and Simple Face Mask  Additional Equipment:   Intra-op Plan:   Post-operative Plan:   Informed Consent: I have reviewed the patients History and Physical, chart, labs and discussed the procedure including the risks, benefits and alternatives for the proposed anesthesia with the patient or authorized representative who has indicated his/her understanding and acceptance.     Dental advisory given  Plan Discussed with: CRNA  Anesthesia Plan Comments:       Anesthesia Quick Evaluation

## 2018-06-25 NOTE — Progress Notes (Signed)
Assisted Dr. Howze with right, ultrasound guided, adductor canal block. Side rails up, monitors on throughout procedure. See vital signs in flow sheet. Tolerated Procedure well.  

## 2018-06-25 NOTE — Op Note (Signed)
06/25/2018  1:09 PM  PATIENT:  Alicia Reyes    PRE-OPERATIVE DIAGNOSIS:  OA RIGHT KNEE  POST-OPERATIVE DIAGNOSIS:  Same  PROCEDURE:  UNICOMPARTMENTAL KNEE  SURGEON:  Sheral Apleyimothy D Meet Weathington, MD  PHYSICIAN ASSISTANT: Aquilla HackerHenry Martensen, PA-C, he was present and scrubbed throughout the case, critical for completion in a timely fashion, and for retraction, instrumentation, and closure.   ANESTHESIA:   General  PREOPERATIVE INDICATIONS:  Alicia BaptistJohnnie Reyes is a  73 y.o. female with a diagnosis of OA RIGHT KNEE who failed conservative measures and elected for surgical management.    The risks benefits and alternatives were discussed with the patient preoperatively including but not limited to the risks of infection, bleeding, nerve injury, cardiopulmonary complications, blood clots, the need for revision surgery, among others, and the patient was willing to proceed.  OPERATIVE IMPLANTS: Biomet [place] mobile bearing medial compartment arthroplasty. Femoral Component: small. Tibial tray: a, Size 4 poly.   OPERATIVE FINDINGS: Endstage grade 4 medial compartment osteoarthritis. No significant changes in the lateral or patellofemoral joint  OPERATIVE PROCEDURE: The patient was brought to the operating room placed in supine position. General anesthesia was administered. IV antibiotics were given. The lower extremity was placed in the legholder and prepped and draped in usual sterile fashion.  Time out was performed.  The leg was elevated and exsanguinated and the tourniquet was inflated. Anteromedial incision was performed, and I took care to preserve the MCL. Parapatellar incision was carried out, and the osteophytes were excised, along with the medial meniscus and a small portion of the fat pad.  The extra medullary tibial cutting jig was applied, using the spoon and the 4mm G-Clamp, and I took care to protect the anterior cruciate ligament insertion and the tibial spine. The medial collateral  ligament was also protected, and I resected my proximal tibia, matching the anatomic slope.   The proximal tibial bony cut was removed in one piece, and I turned my attention to the femur.  The intramedullary femoral rod was placed using the drill, and then using the appropriate reference, I assembled the femoral jig, setting my posterior cutting block. I resected my posterior femur, and then measured my gap.   I then used the mill to match the extension gap to the flexion gap. The gaps were then measured again with the appropriate feeler gauges. Once I had balanced flexion and extension gaps, I then completed the preparation of the femur.  I milled off the anterior aspect of the distal femur to prevent impingement. I also exposed the tibia, and selected the above-named component, and then used the cutting jig to prepare the keel slot on the tibia. I also used the awl to curette out the bone to complete the preparation of the keel. The back wall was intact.  I then placed trial components, and it was found to have excellent motion, and appropriate balance.  I then cemented the components into place, cementing the tibia first, removing all excess cement, and then cementing the femur.  All loose cement was removed.  The real polyethylene insert was applied manually, and the knee was taken through functional range of motion, and found to have excellent stability and restoration of joint motion, with excellent balance.  The wounds were irrigated copiously, and the parapatellar tissue closed with Vicryl, followed by Vicryl for the subcutaneous tissue, with routine closure with Steri-Strips and sterile gauze.  The tourniquet was released, and the patient was awakened and extubated and returned to PACU in stable and  satisfactory condition. There were no complications.  POSTOPERATIVE PLAN: DVT px will consist of SCD's, mobiliation and chemical px, WBAT     Renette Butters, MD

## 2018-06-25 NOTE — Transfer of Care (Signed)
Immediate Anesthesia Transfer of Care Note  Patient: Alicia Reyes  Procedure(s) Performed: UNICOMPARTMENTAL KNEE (Right Knee)  Patient Location: PACU  Anesthesia Type:Spinal  Level of Consciousness: awake, alert , oriented and patient cooperative  Airway & Oxygen Therapy: Patient Spontanous Breathing  Post-op Assessment: Report given to RN and Post -op Vital signs reviewed and stable  Post vital signs: Reviewed and stable  Last Vitals:  Vitals Value Taken Time  BP 105/52 06/25/2018  2:01 PM  Temp    Pulse 67 06/25/2018  2:02 PM  Resp 13 06/25/2018  2:02 PM  SpO2 98 % 06/25/2018  2:02 PM  Vitals shown include unvalidated device data.  Last Pain:  Vitals:   06/25/18 1044  PainSc: 0-No pain      Patients Stated Pain Goal: 4 (06/25/18 1044)  Complications: No apparent anesthesia complications

## 2018-06-25 NOTE — Anesthesia Procedure Notes (Signed)
Spinal  Patient location during procedure: OR Start time: 06/25/2018 12:03 PM End time: 06/25/2018 12:08 PM Staffing Resident/CRNA: Vanessa Hennessey, CRNA Performed: resident/CRNA  Preanesthetic Checklist Completed: patient identified, site marked, surgical consent, pre-op evaluation, timeout performed, IV checked, risks and benefits discussed and monitors and equipment checked Spinal Block Patient position: sitting Prep: DuraPrep Patient monitoring: heart rate, continuous pulse ox and blood pressure Approach: midline Location: L3-4 Injection technique: single-shot Needle Needle type: Pencan  Needle gauge: 24 G Needle length: 10 cm Needle insertion depth: 7 cm Assessment Sensory level: T6

## 2018-06-25 NOTE — Discharge Instructions (Signed)

## 2018-06-25 NOTE — Anesthesia Procedure Notes (Signed)
Anesthesia Regional Block: Adductor canal block   Pre-Anesthetic Checklist: ,, timeout performed, Correct Patient, Correct Site, Correct Laterality, Correct Procedure, Correct Position, site marked, Risks and benefits discussed, pre-op evaluation,  At surgeon's request and post-op pain management  Laterality: Right  Prep: Maximum Sterile Barrier Precautions used, chloraprep       Needles:  Injection technique: Single-shot  Needle Type: Echogenic Stimulator Needle     Needle Length: 9cm  Needle Gauge: 22     Additional Needles:   Procedures:,,,, ultrasound used (permanent image in chart),,,,  Narrative:  Start time: 06/25/2018 11:38 AM End time: 06/25/2018 11:40 AM Injection made incrementally with aspirations every 5 mL.  Performed by: Personally  Anesthesiologist: Kaylyn Layer, MD  Additional Notes: Risks, benefits, and alternative discussed. Patient gave consent for procedure. Patient prepped and draped in sterile fashion. Sedation administered, patient remains easily responsive to voice. Relevant anatomy identified with ultrasound guidance. Local anesthetic given in 5cc increments with no signs or symptoms of intravascular injection. No pain or paraesthesias with injection. Patient monitored throughout procedure with signs of LAST or immediate complications. Tolerated well. Ultrasound image placed in chart.  Amalia Greenhouse, MD

## 2018-06-25 NOTE — Anesthesia Procedure Notes (Signed)
Procedure Name: MAC Date/Time: 06/25/2018 12:10 PM Performed by: Claudia Desanctis, CRNA Pre-anesthesia Checklist: Patient identified, Emergency Drugs available, Suction available and Patient being monitored Oxygen Delivery Method: Simple face mask

## 2018-06-25 NOTE — Anesthesia Postprocedure Evaluation (Signed)
Anesthesia Post Note  Patient: Alicia Reyes  Procedure(s) Performed: UNICOMPARTMENTAL KNEE (Right Knee)     Patient location during evaluation: PACU Anesthesia Type: Spinal Level of consciousness: awake and alert Pain management: pain level controlled Vital Signs Assessment: post-procedure vital signs reviewed and stable Respiratory status: spontaneous breathing, nonlabored ventilation and respiratory function stable Cardiovascular status: blood pressure returned to baseline and stable Postop Assessment: no apparent nausea or vomiting and spinal receding Anesthetic complications: no    Last Vitals:  Vitals:   06/25/18 1700 06/25/18 1832  BP: 119/68 (!) 141/74  Pulse: 72 75  Resp: 16 16  Temp: 36.8 C   SpO2: 100% 100%    Last Pain:  Vitals:   06/25/18 1740  TempSrc:   PainSc: 2                  Kaylyn Layer

## 2018-06-25 NOTE — Interval H&P Note (Signed)
I participated in the care of this patient and agree with the above history, physical and evaluation. I performed a review of the history and a physical exam as detailed   Jolette Lana Daniel Ted Leonhart MD  

## 2018-06-26 DIAGNOSIS — M1711 Unilateral primary osteoarthritis, right knee: Secondary | ICD-10-CM | POA: Diagnosis not present

## 2018-06-26 NOTE — Progress Notes (Signed)
    Subjective: Patient reports pain as mild to moderate.  Tolerating diet.  Urinating.  No CP, SOB.  OOB mobilizing well with therapy.  Objective:   VITALS:   Vitals:   06/25/18 1832 06/25/18 2139 06/26/18 0207 06/26/18 0607  BP: (!) 141/74 138/63 118/68 104/60  Pulse: 75 76 73 62  Resp: 16 16 16 15   Temp:  99.5 F (37.5 C) 98.7 F (37.1 C) 97.6 F (36.4 C)  TempSrc:  Oral Oral Oral  SpO2: 100% 95% 95% 96%  Weight:      Height:       CBC Latest Ref Rng & Units 06/18/2018  WBC 4.0 - 10.5 K/uL 4.3  Hemoglobin 12.0 - 15.0 g/dL 14.4  Hematocrit 81.8 - 46.0 % 42.5  Platelets 150 - 400 K/uL 260   BMP Latest Ref Rng & Units 06/18/2018  Glucose 70 - 99 mg/dL 98  BUN 8 - 23 mg/dL 10  Creatinine 5.63 - 1.49 mg/dL 7.02  Sodium 637 - 858 mmol/L 143  Potassium 3.5 - 5.1 mmol/L 4.5  Chloride 98 - 111 mmol/L 106  CO2 22 - 32 mmol/L 27  Calcium 8.9 - 10.3 mg/dL 9.6   Intake/Output      01/14 0701 - 01/15 0700 01/15 0701 - 01/16 0700   P.O. 60    I.V. (mL/kg) 3109.6 (41.6)    IV Piggyback 200.1    Total Intake(mL/kg) 3369.7 (45)    Urine (mL/kg/hr) 1400    Blood 20    Total Output 1420    Net +1949.7            Physical Exam: General: NAD.  Upright in bed.  Calm, conversant. Resp: No increased wob Cardio: regular rate and rhythm ABD soft Neurologically intact MSK RLE: Neurovascularly intact Sensation intact distally Feet warm Dorsiflexion/Plantar flexion intact Incision: dressing C/D/I   Assessment: 1 Day Post-Op  S/P Procedure(s) (LRB): UNICOMPARTMENTAL KNEE (Right) by Dr. Jewel Baize. Murphy on 06/25/2018  Principal Problem:   Primary osteoarthritis of right knee Active Problems:   Anxiety state   Hyperlipidemia   Primary localized osteoarthritis of knee   Primary osteoarthritis, status post right unicompartmental knee arthroplasty Doing well postop day 1. Eating, drinking, and voiding Pain controlled Mobilizing well with therapy  Plan: Up with  therapy Incentive Spirometry Elevate and Apply ice CPM, bone foam  Weight Bearing: Weight Bearing as Tolerated (WBAT)  Dressings: Maintain Mepilex.  Please apply thigh high TED hose to operative leg prior to discharge. VTE prophylaxis: Aspirin, SCDs, ambulation Dispo: Home today after therapies    Patient's anticipated LOS is less than 2 midnights, meeting these requirements: - Lives within 1 hour of care - Has a competent adult at home to recover with post-op recover - NO history of  - Chronic pain requiring opiods  - Diabetes  - Coronary Artery Disease  - Heart failure  - Heart attack  - Stroke  - DVT/VTE  - Cardiac arrhythmia  - Respiratory Failure/COPD  - Renal failure  - Anemia  - Advanced Liver disease    Albina Billet III, PA-C 06/26/2018, 7:56 AM

## 2018-06-26 NOTE — Plan of Care (Signed)
  Problem: Clinical Measurements: Goal: Ability to maintain clinical measurements within normal limits will improve Outcome: Progressing Goal: Will remain free from infection Outcome: Progressing Goal: Respiratory complications will improve Outcome: Progressing   Problem: Pain Managment: Goal: General experience of comfort will improve Outcome: Progressing   Problem: Safety: Goal: Ability to remain free from injury will improve Outcome: Progressing   

## 2018-06-26 NOTE — Progress Notes (Signed)
Physical Therapy Treatment Patient Details Name: Alicia Reyes MRN: [id number] DOB: 1945-10-13 Today's Date: 06/26/2018    History of Present Illness 73 yo female s/p R UKR on 06/25/18. 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     PT Comments    Pt anxious but motivated and progressing well with mobility.  Pt reviewed stairs and home therex program with written instruction provided.   Follow Up Recommendations  Follow surgeon's recommendation for DC plan and follow-up therapies;Supervision for mobility/OOB     Equipment Recommendations  None recommended by PT    Recommendations for Other Services       Precautions / Restrictions Precautions Precautions: Fall Restrictions Weight Bearing Restrictions: No Other Position/Activity Restrictions: WBAT     Mobility  Bed Mobility               General bed mobility comments: Pt up in chair and requests back to same  Transfers Overall transfer level: Needs assistance Equipment used: Rolling walker (2 wheeled) Transfers: Sit to/from Stand Sit to Stand: Supervision         General transfer comment: cues for LE management and use of UEs to self assist  Ambulation/Gait Ambulation/Gait assistance: Min guard;Supervision Gait Distance (Feet): 200 Feet Assistive device: Rolling walker (2 wheeled) Gait Pattern/deviations: Step-to pattern;Step-through pattern;Decreased step length - right;Decreased step length - left;Shuffle;Trunk flexed Gait velocity: decr    General Gait Details: cues for sequence, posture and postion from RW   Stairs Stairs: Yes Stairs assistance: Min assist Stair Management: One rail Right;Step to pattern;Forwards;Two rails Number of Stairs: 5 General stair comments: 2 steps with bil rails and 3 steps with single rail   Wheelchair Mobility    Modified Rankin (Stroke Patients Only)       Balance  Overall balance assessment: Mild deficits observed, not formally tested                                          Cognition Arousal/Alertness: Awake/alert Behavior During Therapy: WFL for tasks assessed/performed Overall Cognitive Status: Within Functional Limits for tasks assessed                                        Exercises Total Joint Exercises Ankle Circles/Pumps: AROM;Both;15 reps;Supine Quad Sets: AROM;Both;10 reps;Supine Heel Slides: AAROM;Right;Supine;20 reps Straight Leg Raises: AROM;AAROM;Right;15 reps Goniometric ROM: AAROM R knee -5 - 100    General Comments        Pertinent Vitals/Pain Pain Assessment: 0-10 Pain Score: 2  Pain Location: R knee  Pain Descriptors / Indicators: Sore;Tightness Pain Intervention(s): Limited activity within patient's tolerance;Monitored during session;Premedicated before session;Ice applied    Home Living Family/patient expects to be discharged to:: Private residence Living Arrangements: Spouse/significant other Available Help at Discharge: Family         Home Equipment: Dan Humphreys - 2 wheels;Bedside commode      Prior Function Level of Independence: Independent with assistive device(s)          PT Goals (current goals can now be found in the care plan section) Acute Rehab PT Goals Patient Stated Goal: walk without a limip PT Goal Formulation: With patient Time For Goal Achievement: 07/02/18 Potential to Achieve Goals: Good Progress towards PT goals: Progressing  toward goals    Frequency    7X/week      PT Plan Current plan remains appropriate    Co-evaluation              AM-PAC PT "6 Clicks" Mobility   Outcome Measure  Help needed turning from your back to your side while in a flat bed without using bedrails?: A Little Help needed moving from lying on your back to sitting on the side of a flat bed without using bedrails?: A Little Help needed moving to and from a bed  to a chair (including a wheelchair)?: A Little Help needed standing up from a chair using your arms (e.g., wheelchair or bedside chair)?: A Little Help needed to walk in hospital room?: A Little Help needed climbing 3-5 steps with a railing? : A Little 6 Click Score: 18    End of Session Equipment Utilized During Treatment: Gait belt Activity Tolerance: Patient tolerated treatment well Patient left: in chair;with call bell/phone within reach Nurse Communication: Mobility status PT Visit Diagnosis: Other abnormalities of gait and mobility (R26.89);Difficulty in walking, not elsewhere classified (R26.2)     Time: 0761-5183 PT Time Calculation (min) (ACUTE ONLY): 36 min  Charges:  $Gait Training: 8-22 mins $Therapeutic Exercise: 8-22 mins                     Mauro Kaufmann PT Acute Rehabilitation Services Pager [phone] Office [phone]    Ayjah Show 06/26/2018, 12:28 PM

## 2018-06-26 NOTE — Evaluation (Signed)
Occupational Therapy Evaluation Patient Details Name: Alicia BaptistJohnnie Reyes MRN: [id number] DOB: 1945/10/29 Today's Date: 06/26/2018    History of Present Illness 73 yo female s/p R UKR on 06/25/18. 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    Clinical Impression   Pt was admitted for the above.  At baseline, she is independent/mod I for adls. She needs occasional min A at this time. No further OT needs at this time    Follow Up Recommendations  Supervision/Assistance - 24 hour    Equipment Recommendations  None recommended by OT    Recommendations for Other Services       Precautions / Restrictions Precautions Precautions: Fall Restrictions Weight Bearing Restrictions: No Other Position/Activity Restrictions: WBAT       Mobility Bed Mobility               General bed mobility comments: oob  Transfers   Equipment used: Rolling walker (2 wheeled)   Sit to Stand: Supervision              Balance                                           ADL either performed or assessed with clinical judgement   ADL Overall ADL's : Needs assistance/impaired     Grooming: Wash/dry hands;Supervision/safety;Standing                   Toilet Transfer: Supervision/safety;Ambulation;RW;Comfort height toilet   Toileting- Clothing Manipulation and Hygiene: Supervision/safety;Sit to/from stand         General ADL Comments: Pt needs min A for shoes/socks; very flexible.  Educated on shower transfer, but she did not want to practice. Educated that she can use 3:1 as a shower seat.  She states that when she used it once at home, it caught skin and pinched her. She can get up from comfort height commode with supervision, even without sink next to her.  Educated on knee precautions and general safety     Vision         Perception     Praxis      Pertinent  Vitals/Pain Pain Score: 1  Pain Location: R knee  Pain Descriptors / Indicators: Sore;Tightness Pain Intervention(s): Limited activity within patient's tolerance;Monitored during session;Premedicated before session;Repositioned     Hand Dominance Right   Extremity/Trunk Assessment Upper Extremity Assessment Upper Extremity Assessment: Overall WFL for tasks assessed           Communication Communication Communication: No difficulties   Cognition Arousal/Alertness: Awake/alert Behavior During Therapy: WFL for tasks assessed/performed Overall Cognitive Status: Within Functional Limits for tasks assessed                                     General Comments       Exercises     Shoulder Instructions      Home Living Family/patient expects to be discharged to:: Private residence Living Arrangements: Spouse/significant other Available Help at Discharge: Family               Bathroom Shower/Tub: Walk-in shower   Bathroom Toilet: Handicapped height     Home Equipment: Environmental consultantWalker - 2 wheels;Bedside commode  Prior Functioning/Environment Level of Independence: Independent with assistive device(s)                 OT Problem List:        OT Treatment/Interventions:      OT Goals(Current goals can be found in the care plan section) Acute Rehab OT Goals Patient Stated Goal: none stated  OT Goal Formulation: All assessment and education complete, DC therapy  OT Frequency:     Barriers to D/C:            Co-evaluation              AM-PAC OT "6 Clicks" Daily Activity     Outcome Measure Help from another person eating meals?: None Help from another person taking care of personal grooming?: A Little Help from another person toileting, which includes using toliet, bedpan, or urinal?: A Little Help from another person bathing (including washing, rinsing, drying)?: A Little Help from another person to put on and taking off regular  upper body clothing?: A Little Help from another person to put on and taking off regular lower body clothing?: A Little 6 Click Score: 19   End of Session    Activity Tolerance: Patient tolerated treatment well Patient left: in chair;with call bell/phone within reach  OT Visit Diagnosis: Pain Pain - Right/Left: Right Pain - part of body: Knee                Time: 0902-0922 OT Time Calculation (min): 20 min Charges:  OT General Charges $OT Visit: 1 Visit OT Evaluation $OT Eval Low Complexity: 1 Low  Marica OtterMaryellen Agostino Gorin, OTR/L Acute Rehabilitation Services [phone] [facility] pager [phone] office 06/26/2018  Amaura Authier 06/26/2018, 10:57 AM

## 2018-06-26 NOTE — Discharge Summary (Signed)
Discharge Summary  Patient ID: Alicia Reyes MRN: [id number] DOB/AGE: [birth year] 73 y.o.  Admit date: 06/25/2018 Discharge date: 06/26/2018  Admission Diagnoses:  Primary osteoarthritis of right knee  Discharge Diagnoses:  Principal Problem:   Primary osteoarthritis of right knee Active Problems:   Anxiety state   Hyperlipidemia   Primary localized osteoarthritis of knee   Past Medical History:  Diagnosis Date  . Anxiety   . Arthritis   . GERD (gastroesophageal reflux disease)   . Headache    cluster headaches  . Rosacea     Surgeries: Procedure(s): UNICOMPARTMENTAL KNEE on 06/25/2018   Consultants (if any):   Discharged Condition: Improved  Hospital Course: Alicia Reyes is an 73 y.o. female who was admitted 06/25/2018 with a diagnosis of Primary osteoarthritis of right knee and went to the operating room on 06/25/2018 and underwent the above named procedures.    She was given perioperative antibiotics:  Anti-infectives (From admission, onward)   Start     Dose/Rate Route Frequency Ordered Stop   06/25/18 1800  ceFAZolin (ANCEF) IVPB 2g/100 mL premix     2 g 200 mL/hr over 30 Minutes Intravenous Every 6 hours 06/25/18 1532 06/26/18 0053   06/25/18 0930  ceFAZolin (ANCEF) IVPB 2g/100 mL premix     2 g 200 mL/hr over 30 Minutes Intravenous On call to O.R. 06/25/18 0922 06/25/18 1212   06/25/18 0930  vancomycin (VANCOCIN) IVPB 1000 mg/200 mL premix     1,000 mg 200 mL/hr over 60 Minutes Intravenous  Once 06/25/18 0921 06/25/18 1212    .  She was given sequential compression devices, early ambulation, and aspirin for DVT prophylaxis.  She benefited maximally from the hospital stay and there were no complications.    Recent vital signs:  Vitals:   06/26/18 0207 06/26/18 0607  BP: 118/68 104/60  Pulse: 73 62  Resp: 16 15  Temp: 98.7 F (37.1 C) 97.6 F (36.4 C)  SpO2: 95% 96%    Recent laboratory studies:  Lab Results  Component Value Date   HGB  13.5 06/18/2018   Lab Results  Component Value Date   WBC 4.3 06/18/2018   PLT 260 06/18/2018   No results found for: INR Lab Results  Component Value Date   NA 143 06/18/2018   K 4.5 06/18/2018   CL 106 06/18/2018   CO2 27 06/18/2018   BUN 10 06/18/2018   CREATININE 0.60 06/18/2018   GLUCOSE 98 06/18/2018    Discharge Medications:   Allergies as of 06/26/2018      Reactions   Latex Itching, Hives   Codeine Itching   Plasticized Base [plastibase] Itching   Redness of skin       Medication List    STOP taking these medications   acetaminophen 500 MG tablet Commonly known as:  TYLENOL     TAKE these medications   acyclovir 400 MG tablet Commonly known as:  ZOVIRAX Take 400 mg by mouth 3 (three) times daily as needed (outbreaks).   aspirin EC 81 MG tablet Take 1 tablet (81 mg total) by mouth 2 (two) times daily. For DVT prophylaxis for 30 days after surgery. What changed:    when to take this  additional instructions   Calcium-Magnesium 500-250 MG Tabs Take 2 capsules by mouth 2 (two) times a week.   celecoxib 200 MG capsule Commonly known as:  CELEBREX Take 1 capsule (200 mg total) by mouth 2 (two) times daily for 14 days. For 2 weeks post op  for pain and inflammation.  Discontinue Ibuprofen or other Anti-inflammatory medicine when taking this medicine.   cetirizine 10 MG tablet Commonly known as:  ZYRTEC Take 10 mg by mouth daily as needed for allergies.   docusate sodium 100 MG capsule Commonly known as:  COLACE Take 1 capsule (100 mg total) by mouth 2 (two) times daily. To prevent constipation while taking pain medication.   gabapentin 300 MG capsule Commonly known as:  NEURONTIN Take 1 capsule (300 mg total) by mouth 2 (two) times daily for 14 days. For 2 weeks post op for pain.   GLUCOSAMINE CHONDR 1500 COMPLX Caps Take 5 capsules by mouth 2 (two) times a week.   HYDROcodone-acetaminophen 5-325 MG tablet Commonly known as:  NORCO Take 1-2  tablets by mouth every 6 (six) hours as needed for up to 7 days for moderate pain.   LORazepam 1 MG tablet Commonly known as:  ATIVAN Take 1 mg by mouth at bedtime as needed for anxiety or sleep.   Lutein 10 MG Tabs Take 10 mg by mouth 2 (two) times a week.   methocarbamol 500 MG tablet Commonly known as:  ROBAXIN Take 1 tablet (500 mg total) by mouth every 8 (eight) hours as needed for muscle spasms.   omeprazole 20 MG capsule Commonly known as:  PRILOSEC Take 1 capsule (20 mg total) by mouth daily. 30 days for gastroprotection while taking NSAIDs.   ondansetron 4 MG tablet Commonly known as:  ZOFRAN Take 1 tablet (4 mg total) by mouth every 8 (eight) hours as needed for nausea or vomiting.   OVER THE COUNTER MEDICATION Take 1 capsule by mouth 2 (two) times a week. Coenzyme Q-10 200 mg w/Vitamin D 200 mg   PHILLIPS COLON HEALTH Caps Take 1 capsule by mouth 2 (two) times a week.   polyethylene glycol powder powder Commonly known as:  GLYCOLAX/MIRALAX Take 17 g by mouth 2 (two) times a week.   REFRESH 1.4-0.6 % Soln Generic drug:  Polyvinyl Alcohol-Povidone PF Place 1 drop into both eyes daily as needed (dry eyes).   rosuvastatin 5 MG tablet Commonly known as:  CRESTOR Take 5 mg by mouth 2 (two) times a week.       Diagnostic Studies: Mr Lumbar Spine Wo Contrast  Result Date: 06/03/2018 CLINICAL DATA:  Right hip and leg pain for 3 months EXAM: MRI LUMBAR SPINE WITHOUT CONTRAST TECHNIQUE: Multiplanar, multisequence MR imaging of the lumbar spine was performed. No intravenous contrast was administered. COMPARISON:  None. FINDINGS: Segmentation:  Standard. Alignment: Minimal 1-2 mm anterolisthesis of L5 on S1 secondary to bilateral L5 pars interarticularis defects. Vertebrae:  No fracture, evidence of discitis, or bone lesion. Conus medullaris and cauda equina: Conus extends to the L1 level. Conus and cauda equina appear normal. Paraspinal and other soft tissues: Negative.  Disc levels: Disc spaces: Disc desiccation at L5-S1. T12-L1: No significant disc bulge. No evidence of neural foraminal stenosis. No central canal stenosis. L1-L2: No significant disc bulge. No evidence of neural foraminal stenosis. No central canal stenosis. L2-L3: Mild broad-based disc bulge. Mild bilateral facet arthropathy. No evidence of neural foraminal stenosis. No central canal stenosis. L3-L4: Mild broad-based disc bulge. Moderate bilateral facet arthropathy. Mild spinal stenosis. No central canal stenosis. L4-L5: Minimal broad-based disc bulge. Moderate bilateral facet arthropathy. No evidence of neural foraminal stenosis. No central canal stenosis. L5-S1: Broad-based disc bulge. Mild bilateral facet arthropathy. Mild left foraminal stenosis. No right foraminal stenosis. No evidence of neural foraminal stenosis. No central  canal stenosis. IMPRESSION: 1. Lumbar spine spondylosis as described above. 2.  No acute osseous injury of the lumbar spine. Electronically Signed   By: Elige Ko   On: 06/03/2018 11:29   Dg Knee Right Port  Result Date: 06/25/2018 CLINICAL DATA:  Status post partial right knee arthroplasty today. EXAM: PORTABLE RIGHT KNEE - 1-2 VIEW COMPARISON:  None. FINDINGS: The patient is status post medial compartment arthroplasty of the right knee. Hardware is intact and appears appropriately positioned. No acute abnormality is identified. Gas in the soft tissues from surgery noted. IMPRESSION: Status post medial compartment arthroplasty right knee. No acute finding. Electronically Signed   By: Drusilla Kanner M.D.   On: 06/25/2018 14:28    Disposition: Discharge disposition: 01-Home or Self Care       Discharge Instructions    Discharge patient   Complete by:  As directed    Discharge disposition:  01-Home or Self Care   Discharge patient date:  06/26/2018      Follow-up Information    Sheral Apley, MD. Go to.   Specialty:  Orthopedic Surgery Why:  Your  appointment has been made for 11:15 am Contact information: [address] [place] [address] [phone]        [facility]. Go on 07/11/2018.   Why:  you are scheduled to start Outpaitent physical therapy at 1:00. Please arrive a few minutes early to complete your paperwork.   Contact information: [place], [place]   [phone]       [facility] Follow up.   Specialty:  Home Health Services Why:  HHPT will see you at home for 5 visits prior to your follow up with Dr. Eulah Pont on 07/10/18  Contact information: [address] [place] [address] [phone]        Sheral Apley, MD.   Specialty:  Orthopedic Surgery Contact information: [address] [place] [address] [phone]            Signed: Albina Billet III PA-C 06/26/2018, 7:58 AM

## 2018-06-27 ENCOUNTER — Encounter (HOSPITAL_COMMUNITY): Payer: Self-pay | Admitting: Orthopedic Surgery

## 2018-06-28 ENCOUNTER — Ambulatory Visit: Payer: Medicare Other | Admitting: Physical Therapy

## 2018-07-11 ENCOUNTER — Other Ambulatory Visit: Payer: Self-pay

## 2018-07-11 ENCOUNTER — Ambulatory Visit: Payer: Medicare Other | Attending: Orthopedic Surgery | Admitting: Physical Therapy

## 2018-07-11 DIAGNOSIS — R262 Difficulty in walking, not elsewhere classified: Secondary | ICD-10-CM | POA: Insufficient documentation

## 2018-07-11 DIAGNOSIS — R2689 Other abnormalities of gait and mobility: Secondary | ICD-10-CM | POA: Insufficient documentation

## 2018-07-11 DIAGNOSIS — M25661 Stiffness of right knee, not elsewhere classified: Secondary | ICD-10-CM | POA: Diagnosis present

## 2018-07-11 DIAGNOSIS — M25561 Pain in right knee: Secondary | ICD-10-CM | POA: Insufficient documentation

## 2018-07-11 DIAGNOSIS — M6281 Muscle weakness (generalized): Secondary | ICD-10-CM | POA: Insufficient documentation

## 2018-07-11 NOTE — Therapy (Addendum)
[facility] [address]  [address] [place], [place], [address] Phone: [phone]   Fax:  [phone]  Physical Therapy Evaluation  Patient Details  Name: Alicia Reyes MRN: [id number] Date of Birth: [birth year] Referring Provider (PT): Sheral Apley, MD   Encounter Date: 07/11/2018  PT End of Session - 07/11/18 1305    Visit Number  1    Number of Visits  18    Date for PT Re-Evaluation  08/22/18    Authorization Type  Medicare & [employer]etna    PT Start Time  1305    PT Stop Time  1411    PT Time Calculation (min)  66 min    Activity Tolerance  Patient tolerated treatment well    Behavior During Therapy  Alicia Reyes for tasks assessed/performed       Past Medical History:  Diagnosis Date  . Anxiety   . Arthritis   . GERD (gastroesophageal reflux disease)   . Headache    cluster headaches  . Rosacea     Past Surgical History:  Procedure Laterality Date  . ABDOMINAL HYSTERECTOMY    . CARPAL TUNNEL RELEASE     left hand  . CATARACT EXTRACTION, BILATERAL     with lens implant  . CHOLECYSTECTOMY    . DILATION AND CURETTAGE OF UTERUS    . EYE SURGERY     retina surgery -bilateral  . FOOT SURGERY     bilateral feet  . PARTIAL KNEE ARTHROPLASTY Right 06/25/2018   Procedure: UNICOMPARTMENTAL KNEE;  Surgeon: Sheral Apley, MD;  Location: [facility];  Service: Orthopedics;  Laterality: Right;    There were no vitals filed for this visit.   Subjective Assessment - 07/11/18 1308    Subjective  Pt s/p R partial knee replacement on 06/25/18 with HH PT until Monday 07/08/18. Feels she has been doing well until final visit with [facility] on Monday, but since then has had increased pain causing her to limit activity and rely more on the cane or RW this week.    Pertinent History  R partial knee replacement - 06/25/18    Limitations  Sitting;Standing;Walking;House hold activities    How long can you sit comfortably?  "squiming all the  time"    How long can you stand comfortably?  <15 minutes    How long can you walk comfortably?  15 minutes    Patient Stated Goals  "to improve my balance and make my leg stronger so I can do what I wan to do"    Currently in Pain?  Yes    Pain Score  5     Pain Location  Knee    Pain Orientation  Right;Anterior;Lateral    Pain Descriptors / Indicators  Aching   & pinching above the knee   Pain Type  Acute pain;Surgical pain    Pain Radiating Towards  up into groin & back of leg    Pain Onset  1 to 4 weeks ago    Pain Frequency  Constant    Aggravating Factors   sitting, sit <> transitions, walking    Pain Relieving Factors  nothing    Effect of Pain on Daily Activities  having to use the cane more and walker intermittnently, feels like she can't put out as much effort with exercises         [facility] PT Assessment - 07/11/18 1305      Assessment   Medical Diagnosis  R partial knee replacement    Referring Provider (PT)  Sheral Apleyimothy D Murphy, MD    Onset Date/Surgical Date  06/25/18    Next MD Visit  08/07/18    Prior Therapy  HH PT until 07/08/18      Balance Screen   Has the patient fallen in the past 6 months  No    Has the patient had a decrease in activity level because of a fear of falling?   No    Is the patient reluctant to leave their home because of a fear of falling?   No      Home Environment   Living Environment  Private residence    Living Arrangements  Spouse/significant other    Home Access  Stairs to enter    Entrance Stairs-Number of Steps  2-5    Entrance Stairs-Rails  Right;Left;Cannot reach both   no railing in garage entry (2 steps)   Home Layout  One level    Home Equipment  Walker - 2 wheels;Cane - single point;Bedside commode;Shower seat;Grab bars - tub/shower      Prior Function   Level of Independence  Independent    Vocation  Retired    Leisure  visit with sisters; cleaning/remodeling house, shopping, walking daily x 30 minutes      Cognition    Overall Cognitive Status  Within Functional Limits for tasks assessed      Observation/Other Assessments   Focus on Therapeutic Outcomes (FOTO)   Knee - 42% (58% limitation); Predicted 59% (41% limitation)      ROM / Strength   AROM / PROM / Strength  AROM;Strength      AROM   AROM Assessment Site  Knee    Right/Left Knee  Right;Left    Right Knee Extension  2   5 dg with LAQ   Right Knee Flexion  124    Left Knee Extension  0   -3 dg supported in supine   Left Knee Flexion  132      Strength   Strength Assessment Site  Hip;Knee    Right/Left Hip  Right;Left    Right Hip Flexion  4/5    Right Hip Extension  4-/5    Right Hip ABduction  4/5    Right Hip ADduction  4/5    Left Hip Flexion  4-/5    Left Hip Extension  4/5    Left Hip ABduction  4+/5    Left Hip ADduction  4/5    Right/Left Knee  Right;Left    Right Knee Flexion  4-/5    Right Knee Extension  4-/5    Left Knee Flexion  4+/5    Left Knee Extension  4+/5      Flexibility   Soft Tissue Assessment /Muscle Length  yes    Hamstrings  mild tight R>L    ITB  very mild tight R    Piriformis  WFL      Palpation   Patella mobility  mod restriction all directions      Ambulation/Gait   Ambulation/Gait  Yes    Ambulation/Gait Assistance  6: Modified independent (Device/Increase time)    Ambulation Distance (Feet)  60 Feet    Assistive device  Straight cane    Gait Pattern  Step-through pattern;Decreased weight shift to right;Decreased stance time - right;Antalgic    Ambulation Surface  Level;Indoor    Gait velocity  decreased  Objective measurements completed on examination: See above findings.      [facility] Adult PT Treatment/Exercise - 07/11/18 1305      Exercises   Exercises  Knee/Hip      Knee/Hip Exercises: Stretches   Passive Hamstring Stretch  Right;30 seconds;1 rep    Passive Hamstring Stretch Limitations  supine with strap + DF for gastroc stretch      Knee/Hip  Exercises: Standing   Heel Raises  Both;10 reps;3 seconds    Heel Raises Limitations  UE support on counter    Hip Flexion  Right;Left;10 reps;Knee bent;AROM    Hip Flexion Limitations  high knee march - UE support on counter    Hip Abduction  Right;Left;10 reps;Knee straight;Stengthening    Abduction Limitations  UE support on counter    Hip Extension  Right;Left;10 reps;Knee straight;Stengthening    Extension Limitations  UE support on counter    Functional Squat  10 reps;5 seconds    Functional Squat Limitations  counter squat      Modalities    Modalities  Cryotherapy       Cryotherapy   Number Minutes Cryotherapy  10 minutes    Cryotherapy Location   Knee    Type of Cryotherapy  Ice pack           PT Education - 07/11/18 1400    Education Details PT eval findings, anticipated POC & initial HEP   Person(s) Educated  Patient    Methods  Explanation;Demonstration;Handout    Comprehension  Verbalized understanding;Returned demonstration;Need further instruction        PT Short Term Goals - 07/11/18 1400      PT SHORT TERM GOAL #1   Title  Independent with initial HEP    Status  New    Target Date  07/25/18        PT Long Term Goals - 07/11/18 1400      PT LONG TERM GOAL #1   Title  Independent with ongoing/advamced HEP    Status  New    Target Date  08/22/18      PT LONG TERM GOAL #2   Title  Patient will improve R knee extension AROM to 0 dg with no quad lag    Status  New    Target Date  08/22/18      PT LONG TERM GOAL #3   Title  R hip and knee strength >/= 4+/5 for improved stability    Status  New    Target Date  08/22/18      PT LONG TERM GOAL #4   Title  Patient will ambulate with normal gait pattern and speed w/o AD to increase safety with community ambulation    Status  New    Target Date  08/22/18      PT LONG TERM GOAL #5   Title  Patient will navigate up/down 5 stairs with good reciprocal pattern to increase eas of access  to home    Status  New    Target Date  08/22/18             Plan - 07/11/18 1411    Clinical Impression Statement  Alicia Reyes is a 73 y/o female who presents to OP PT 16 days s/p R partial knee replacement on 06/25/18. She received HH PT x ~2 weeks post-op, completed Monday 07/08/18. She arrives to PT ambulating with SPC on L with mild antalgic gait pattern favoring R LE with decreased gait velocity, decreased stride  length and decreased weight shift to R. She reports increased reliance on [facility] and RW this week due to worsening pain, currently 5/10, and feeling of imbalance. Assessment reveals excellent AROM of R knee - currently 2-124 dg, mild decreased proximal LE flexibility with decreased R patellar mobility in all planes, decreased incisional scar mobility, very mild LE edema and mild to moderate B hip and R knee weakness with mild R quad lag. Alicia Reyes will benefit from skilled PT intervention to address the above listed deficits to allow for improved balance and gait to maximize function and mobility.    Clinical Presentation  Stable    Clinical Decision Making  Low    Rehab Potential  Excellent    PT Frequency  3x / week   tapering to 2x/wk after 2 weeks   PT Duration  6 weeks   4-6 weeks   PT Treatment/Interventions  Patient/family education;Therapeutic exercise;Therapeutic activities;Functional mobility training;Gait training;Stair training;Neuromuscular re-education;Balance training;Manual techniques;Passive range of motion;Dry needling;Taping;Electrical Stimulation;Cryotherapy;Vasopneumatic Device;Iontophoresis 4mg /ml Dexamethasone;Moist Heat;ADLs/Self Care Home Management    PT Next Visit Plan  Review initial HEP; LE flexibility; R knee ROM; LE strengthening; Gait training weaning SPC as appropriate; Manual therapy and modalities as indicated    Consulted and Agree with Plan of Care  Patient       Patient will benefit from skilled therapeutic intervention in order to improve the  following deficits and impairments:  Pain, Decreased strength, Impaired flexibility, Decreased range of motion, Increased muscle spasms, Decreased scar mobility, Decreased mobility, Decreased activity tolerance, Difficulty walking, Abnormal gait, Decreased balance, Decreased endurance  Visit Diagnosis: Acute pain of right knee  Stiffness of right knee, not elsewhere classified  Muscle weakness (generalized)  Other abnormalities of gait and mobility  Difficulty in walking, not elsewhere classified     Problem List Patient Active Problem List   Diagnosis Date Noted  . Primary localized osteoarthritis of knee 06/25/2018  . Anxiety state 06/14/2018  . Hyperlipidemia 06/14/2018  . Primary osteoarthritis of right knee 06/14/2018    Alicia Reyes, PT, MPT 07/11/2018, 6:54 PM  Alicia Reyes [address]  [address] [place], [place], [address] Phone: [phone]   Fax:  [phone]  Name: Alicia Reyes MRN: [id number] Date of Birth: 05-16-1946

## 2018-07-15 ENCOUNTER — Ambulatory Visit: Payer: Medicare Other | Attending: Orthopedic Surgery | Admitting: Physical Therapy

## 2018-07-15 ENCOUNTER — Encounter: Payer: Self-pay | Admitting: Physical Therapy

## 2018-07-15 DIAGNOSIS — M6281 Muscle weakness (generalized): Secondary | ICD-10-CM

## 2018-07-15 DIAGNOSIS — M25561 Pain in right knee: Secondary | ICD-10-CM | POA: Diagnosis not present

## 2018-07-15 DIAGNOSIS — M25661 Stiffness of right knee, not elsewhere classified: Secondary | ICD-10-CM | POA: Diagnosis present

## 2018-07-15 DIAGNOSIS — R262 Difficulty in walking, not elsewhere classified: Secondary | ICD-10-CM | POA: Insufficient documentation

## 2018-07-15 DIAGNOSIS — R2689 Other abnormalities of gait and mobility: Secondary | ICD-10-CM | POA: Insufficient documentation

## 2018-07-15 NOTE — Therapy (Signed)
[facility] [place] [address]  [address] [place], [place], [address] Phone: [phone]   Fax:  [phone]  Physical Therapy Treatment  Patient Details  Name: Alicia BaptistJohnnie Reyes MRN: [id number] Date of Birth: 08/28/1945 Referring Provider (PT): Sheral Apleyimothy D Murphy, MD   Encounter Date: 07/15/2018  PT End of Session - 07/15/18 1303    Visit Number  2    Number of Visits  18    Date for PT Re-Evaluation  08/22/18    Authorization Type  Medicare & Aetna    PT Start Time  1303    PT Stop Time  1407    PT Time Calculation (min)  64 min    Activity Tolerance  Patient tolerated treatment well    Behavior During Therapy  [facility] for tasks assessed/performed       Past Medical History:  Diagnosis Date  . Anxiety   . Arthritis   . GERD (gastroesophageal reflux disease)   . Headache    cluster headaches  . Rosacea     Past Surgical History:  Procedure Laterality Date  . ABDOMINAL HYSTERECTOMY    . CARPAL TUNNEL RELEASE     left hand  . CATARACT EXTRACTION, BILATERAL     with lens implant  . CHOLECYSTECTOMY    . DILATION AND CURETTAGE OF UTERUS    . EYE SURGERY     retina surgery -bilateral  . FOOT SURGERY     bilateral feet  . PARTIAL KNEE ARTHROPLASTY Right 06/25/2018   Procedure: UNICOMPARTMENTAL KNEE;  Surgeon: Sheral ApleyMurphy, Timothy D, MD;  Location: [facility];  Service: Orthopedics;  Laterality: Right;    There were no vitals filed for this visit.  Subjective Assessment - 07/15/18 1306    Subjective  Pt reporting her pain was up to 10/10 at times over the weekend. States one steri-strip came off over the weekend and that made it feel better.    Pertinent History  R partial knee replacement - 06/25/18    Limitations  Sitting;Standing;Walking;House hold activities    Patient Stated Goals  "to improve my balance and make my leg stronger so I can do what I wan to do"    Currently in Pain?  Yes    Pain Score  4     Pain Location  Knee     Pain Orientation  Right;Anterior;Lateral    Pain Descriptors / Indicators  Burning;Stabbing    Pain Type  Acute pain;Surgical pain    Pain Frequency  Constant                       [facility] Adult PT Treatment/Exercise - 07/15/18 1303      Exercises   Exercises  Knee/Hip      Knee/Hip Exercises: Stretches   Passive Hamstring Stretch  Right;30 seconds;1 rep    Passive Hamstring Stretch Limitations  supine with strap + DF for gastroc stretch    ITB Stretch  Right;30 seconds;1 rep    ITB Stretch Limitations  supine with strap       Knee/Hip Exercises: Aerobic   Recumbent Bike  L1 x 6 min      Knee/Hip Exercises: Standing   Heel Raises  Both;15 reps;3 seconds    Heel Raises Limitations  cues for pacing & hold times; UE support on counter    Hip Flexion  Right;Left;10 reps;Knee bent;AROM    Hip Flexion Limitations  high knee march - UE support on counter  Hip Abduction  Right;Left;10 reps;Knee straight;Stengthening    Abduction Limitations  cues to avoid ER at hip; UE support on counter    Hip Extension  Right;Left;10 reps;Knee straight;Stengthening    Extension Limitations  UE support on counter      Knee/Hip Exercises: Seated   Long Arc Quad  Right;10 reps    Long Arc Quad Limitations  + hip adduction ball squeeze      Knee/Hip Exercises: Supine   Quad Sets  Both;10 reps;Strengthening    Quad Sets Limitations  + hip adduction ball squeeze with small towel roll under knee    Short Arc Quad Sets  Right;10 reps;AROM;Strengthening    Short Arc Quad Sets Limitations  + hip adduction ball squeeze with 8" FR under knees    Hip Adduction Isometric  Both;10 reps;Strengthening   5" hold   Hip Adduction Isometric Limitations  hip adduction bal squeeze    Bridges  Both;10 reps;Strengthening      Modalities   Modalities  Vasopneumatic      Vasopneumatic   Number Minutes Vasopneumatic   15 minutes    Vasopnuematic Location   Knee    Vasopneumatic Pressure  Low     Vasopneumatic Temperature   lowest      Manual Therapy   Manual Therapy  Joint mobilization;Soft tissue mobilization;Other (comment)    Manual therapy comments  supine & sitting    Joint Mobilization  R knee patellar mobs - all directions (emphasis on medial glide)    Soft tissue mobilization  R lateral HS    Other Manual Therapy  Instructed pt in use of rolling pin for self-STM to HS, quads & ITB             PT Education - 07/15/18 1355    Education Details  HEP update - LE stretches, VMO activation exercises; Instruction in use of rolling pin for self-STM    Person(s) Educated  Patient    Methods  Explanation;Demonstration;Handout    Comprehension  Verbalized understanding;Returned demonstration;Need further instruction       PT Short Term Goals - 07/15/18 1310      PT SHORT TERM GOAL #1   Title  Independent with initial HEP    Status  On-going        PT Long Term Goals - 07/15/18 1310      PT LONG TERM GOAL #1   Title  Independent with ongoing/advamced HEP    Status  On-going      PT LONG TERM GOAL #2   Title  Patient will improve R knee extension AROM to 0 dg with no quad lag    Status  On-going      PT LONG TERM GOAL #3   Title  R hip and knee strength >/= 4+/5 for improved stability    Status  On-going      PT LONG TERM GOAL #4   Title  Patient will ambulate with normal gait pattern and speed w/o AD to increase safety with community ambulation    Status  On-going      PT LONG TERM GOAL #5   Title  Patient will navigate up/down 5 stairs with good reciprocal pattern to increase eas of access to home    Status  On-going            Plan - 07/15/18 1311    Clinical Impression Statement  Vaishnavi reporting no concerns with initial HEP but still experiencing increased pain in lateral anterior  knee which becomes more pronounced in R SLS during opposite  LE hip abduction or extension. Increased lateral quad/ITB/HS tightness and decreased VMO activation  likely contributing to lateral tracking of patella, therefore targeted stretches for HS & ITB as well as strenthening focusing on VMO activation to promote better muscle balance for neutral patellar tracking.    Rehab Potential  Excellent    PT Frequency  3x / week   tapering to 2x/wk after 2 weeks   PT Duration  6 weeks   4-6 weeks   PT Treatment/Interventions  Patient/family education;Therapeutic exercise;Therapeutic activities;Functional mobility training;Gait training;Stair training;Neuromuscular re-education;Balance training;Manual techniques;Passive range of motion;Dry needling;Taping;Electrical Stimulation;Cryotherapy;Vasopneumatic Device;Iontophoresis 4mg /ml Dexamethasone;Moist Heat;ADLs/Self Care Home Management    PT Next Visit Plan  LE flexibility; R knee ROM; LE strengthening; Gait training weaning SPC as appropriate; Manual therapy and modalities as indicated    Consulted and Agree with Plan of Care  Patient       Patient will benefit from skilled therapeutic intervention in order to improve the following deficits and impairments:  Pain, Decreased strength, Impaired flexibility, Decreased range of motion, Increased muscle spasms, Decreased scar mobility, Decreased mobility, Decreased activity tolerance, Difficulty walking, Abnormal gait, Decreased balance, Decreased endurance  Visit Diagnosis: Acute pain of right knee  Stiffness of right knee, not elsewhere classified  Muscle weakness (generalized)  Other abnormalities of gait and mobility  Difficulty in walking, not elsewhere classified     Problem List Patient Active Problem List   Diagnosis Date Noted  . Primary localized osteoarthritis of knee 06/25/2018  . Anxiety state 06/14/2018  . Hyperlipidemia 06/14/2018  . Primary osteoarthritis of right knee 06/14/2018    Marry Guan, PT, MPT 07/15/2018, 2:12 PM  [facility] [address]  [address] [place], [place], [address] Phone: [phone]   Fax:  [phone]  Name: Alicia Reyes MRN: [id number] Date of Birth: [birth year]

## 2018-07-17 ENCOUNTER — Ambulatory Visit: Payer: Medicare Other

## 2018-07-17 DIAGNOSIS — M25661 Stiffness of right knee, not elsewhere classified: Secondary | ICD-10-CM

## 2018-07-17 DIAGNOSIS — R262 Difficulty in walking, not elsewhere classified: Secondary | ICD-10-CM

## 2018-07-17 DIAGNOSIS — M25561 Pain in right knee: Secondary | ICD-10-CM

## 2018-07-17 DIAGNOSIS — M6281 Muscle weakness (generalized): Secondary | ICD-10-CM

## 2018-07-17 DIAGNOSIS — R2689 Other abnormalities of gait and mobility: Secondary | ICD-10-CM

## 2018-07-17 NOTE — Therapy (Signed)
[facility] [place] [address]  [address] [place], [place], [address] Phone: [phone]   Fax:  [phone]  Physical Therapy Treatment  Patient Details  Name: Alicia BaptistJohnnie Reyes MRN: [id number] Date of Birth: 05-Sep-1945 Referring Provider (PT): Sheral Apleyimothy D Murphy, MD   Encounter Date: 07/17/2018  PT End of Session - 07/17/18 1329    Visit Number  3    Number of Visits  18    Date for PT Re-Evaluation  08/22/18    Authorization Type  Medicare & Aetna    PT Start Time  1315    PT Stop Time  1405    PT Time Calculation (min)  50 min    Activity Tolerance  Patient tolerated treatment well    Behavior During Therapy  [facility] for tasks assessed/performed       Past Medical History:  Diagnosis Date  . Anxiety   . Arthritis   . GERD (gastroesophageal reflux disease)   . Headache    cluster headaches  . Rosacea     Past Surgical History:  Procedure Laterality Date  . ABDOMINAL HYSTERECTOMY    . CARPAL TUNNEL RELEASE     left hand  . CATARACT EXTRACTION, BILATERAL     with lens implant  . CHOLECYSTECTOMY    . DILATION AND CURETTAGE OF UTERUS    . EYE SURGERY     retina surgery -bilateral  . FOOT SURGERY     bilateral feet  . PARTIAL KNEE ARTHROPLASTY Right 06/25/2018   Procedure: UNICOMPARTMENTAL KNEE;  Surgeon: Sheral ApleyMurphy, Timothy D, MD;  Location: [facility];  Service: Orthopedics;  Laterality: Right;    There were no vitals filed for this visit.  Subjective Assessment - 07/17/18 1321    Subjective  Pt. reporting some difficulty and pain with squatting activities.    Patient Stated Goals  "to improve my balance and make my leg stronger so I can do what I wan to do"    Currently in Pain?  No/denies    Pain Score  0-No pain   Pain rising to 8/10 with prolonged walking, squat    Pain Location  Knee    Pain Orientation  Right;Anterior;Lateral    Pain Descriptors / Indicators  Burning    Pain Type  Acute pain;Surgical pain    Pain  Onset  1 to 4 weeks ago    Pain Frequency  Constant    Multiple Pain Sites  No                       [facility] Adult PT Treatment/Exercise - 07/17/18 1336      Ambulation/Gait   Ambulation/Gait  Yes    Ambulation/Gait Assistance  5: Supervision    Ambulation/Gait Assistance Details  Cues for increased step length, and R wt. shift with no AD; good carryover; pt. ambulating without evidence of R quad instability with no AD    Ambulation Distance (Feet)  90 Feet    Assistive device  None    Gait Pattern  Step-through pattern;Decreased weight shift to right;Decreased stance time - right;Antalgic    Ambulation Surface  Level;Indoor    Gait velocity  decreased      Knee/Hip Exercises: Stretches   Passive Hamstring Stretch  Right;30 seconds;1 rep    Passive Hamstring Stretch Limitations  supine with strap + DF for gastroc stretch    Gastroc Stretch  Right;2 reps;30 seconds    Gastroc Stretch Limitations  leaning  into counter       Knee/Hip Exercises: Aerobic   Recumbent Bike  L1 x 6 min      Knee/Hip Exercises: Standing   Heel Raises  Both;15 reps;3 seconds    Heel Raises Limitations  counter     Terminal Knee Extension  Right;10 reps;Strengthening;Theraband    Theraband Level (Terminal Knee Extension)  Level 3 (Green)    Terminal Knee Extension Limitations  Cues to prevent hip motion     Wall Squat  10 reps;3 seconds    Wall Squat Limitations  "mini" on wall       Knee/Hip Exercises: Seated   Long Arc Quad  Right;10 reps    Long Arc Quad Weight  2 lbs.    Long [employer] Limitations  + hip adduction ball squeeze      Knee/Hip Exercises: Supine   Straight Leg Raises  Right;10 reps    Straight Leg Raises Limitations  no quad lag visible                PT Short Term Goals - 07/17/18 1437      PT SHORT TERM GOAL #1   Title  Independent with initial HEP    Status  Achieved        PT Long Term Goals - 07/15/18 1310      PT LONG TERM GOAL #1   Title   Independent with ongoing/advamced HEP    Status  On-going      PT LONG TERM GOAL #2   Title  Patient will improve R knee extension AROM to 0 dg with no quad lag    Status  On-going      PT LONG TERM GOAL #3   Title  R hip and knee strength >/= 4+/5 for improved stability    Status  On-going      PT LONG TERM GOAL #4   Title  Patient will ambulate with normal gait pattern and speed w/o AD to increase safety with community ambulation    Status  On-going      PT LONG TERM GOAL #5   Title  Patient will navigate up/down 5 stairs with good reciprocal pattern to increase eas of access to home    Status  On-going            Plan - 07/17/18 1335    Clinical Impression Statement  Pt. reporting some difficulty with squatting activity at home.  Conversation revealed pt. squatting down too far.  Pt. instructed to only squat to depth at kitchen sink that she is comfortable with.  Able to tolerate addition of "mini" wall squat well today without additional pain.  Demonstrated good overall stability ambulating without AD today.  Pt. instructed to ambulate around home without AD and use SPC when out in community.  Pt. and husband verbalized understanding.  Ended visit pain free.      Rehab Potential  Excellent    PT Treatment/Interventions  Patient/family education;Therapeutic exercise;Therapeutic activities;Functional mobility training;Gait training;Stair training;Neuromuscular re-education;Balance training;Manual techniques;Passive range of motion;Dry needling;Taping;Electrical Stimulation;Cryotherapy;Vasopneumatic Device;Iontophoresis 4mg /ml Dexamethasone;Moist Heat;ADLs/Self Care Home Management    PT Next Visit Plan  LE flexibility; R knee ROM; LE strengthening; Gait training weaning SPC as appropriate; Manual therapy and modalities as indicated    Consulted and Agree with Plan of Care  Patient       Patient will benefit from skilled therapeutic intervention in order to improve the following  deficits and impairments:  Pain, Decreased strength, Impaired  flexibility, Decreased range of motion, Increased muscle spasms, Decreased scar mobility, Decreased mobility, Decreased activity tolerance, Difficulty walking, Abnormal gait, Decreased balance, Decreased endurance  Visit Diagnosis: Acute pain of right knee  Stiffness of right knee, not elsewhere classified  Muscle weakness (generalized)  Other abnormalities of gait and mobility  Difficulty in walking, not elsewhere classified     Problem List Patient Active Problem List   Diagnosis Date Noted  . Primary localized osteoarthritis of knee 06/25/2018  . Anxiety state 06/14/2018  . Hyperlipidemia 06/14/2018  . Primary osteoarthritis of right knee 06/14/2018    Kermit Balo, PTA 07/17/18 2:38 PM    [facility] [address]  [address] [place], [place], [address] Phone: [phone]   Fax:  [phone]  Name: Alicia Reyes MRN: [id number] Date of Birth: 1945/10/11

## 2018-07-19 ENCOUNTER — Encounter: Payer: Self-pay | Admitting: Physical Therapy

## 2018-07-19 ENCOUNTER — Ambulatory Visit: Payer: Medicare Other | Admitting: Physical Therapy

## 2018-07-19 DIAGNOSIS — M25561 Pain in right knee: Secondary | ICD-10-CM | POA: Diagnosis not present

## 2018-07-19 DIAGNOSIS — M6281 Muscle weakness (generalized): Secondary | ICD-10-CM

## 2018-07-19 DIAGNOSIS — R262 Difficulty in walking, not elsewhere classified: Secondary | ICD-10-CM

## 2018-07-19 DIAGNOSIS — R2689 Other abnormalities of gait and mobility: Secondary | ICD-10-CM

## 2018-07-19 DIAGNOSIS — M25661 Stiffness of right knee, not elsewhere classified: Secondary | ICD-10-CM

## 2018-07-19 NOTE — Therapy (Signed)
[facility] [address]  [address] [place], [place], [address] Phone: [phone]   Fax:  [phone]  Physical Therapy Treatment  Patient Details  Name: Alicia Reyes MRN: [id number] Date of Birth: Jan 23, 1946 Referring Provider (Alicia Reyes): Sheral Apleyimothy D Murphy, MD   Encounter Date: 07/19/2018  Alicia Reyes End of Session - 07/19/18 0802    Visit Number  4    Number of Visits  18    Date for Alicia Reyes Re-Evaluation  08/22/18    Authorization Type  Medicare & Aetna    Alicia Reyes Start Time  0802    Alicia Reyes Stop Time  0843    Alicia Reyes Time Calculation (min)  41 min    Activity Tolerance  Patient tolerated treatment well    Behavior During Therapy  [facility] for tasks assessed/performed       Past Medical History:  Diagnosis Date  . Anxiety   . Arthritis   . GERD (gastroesophageal reflux disease)   . Headache    cluster headaches  . Rosacea     Past Surgical History:  Procedure Laterality Date  . ABDOMINAL HYSTERECTOMY    . CARPAL TUNNEL RELEASE     left hand  . CATARACT EXTRACTION, BILATERAL     with lens implant  . CHOLECYSTECTOMY    . DILATION AND CURETTAGE OF UTERUS    . EYE SURGERY     retina surgery -bilateral  . FOOT SURGERY     bilateral feet  . PARTIAL KNEE ARTHROPLASTY Right 06/25/2018   Procedure: UNICOMPARTMENTAL KNEE;  Surgeon: Sheral ApleyMurphy, Timothy D, MD;  Location: [facility];  Service: Orthopedics;  Laterality: Right;    There were no vitals filed for this visit.  Subjective Assessment - 07/19/18 0804    Subjective  Alicia Reyes reporting her was bothering her more last night - thinks it was because she tried doing her squats and heel raises at the same time yesterday.    Pertinent History  R partial knee replacement - 06/25/18    Patient Stated Goals  "to improve my balance and make my leg stronger so I can do what I wan to do"    Currently in Pain?  Yes    Pain Score  2     Pain Location  Knee    Pain Orientation  Right    Pain Descriptors / Indicators   Sore    Pain Type  Acute pain;Surgical pain    Pain Onset  1 to 4 weeks ago                       [facility] Treatment/Exercise - 07/19/18 0802      Exercises   Exercises  Knee/Hip      Knee/Hip Exercises: Aerobic   Recumbent Bike  L1 x 6 min      Knee/Hip Exercises: Standing   Hip Flexion  Right;Left;10 reps;Knee straight;Stengthening    Hip Flexion Limitations  yellow TB at ankles, UE support on counter    Hip Abduction  Right;Left;10 reps;Knee straight;Stengthening    Abduction Limitations  looped yellow TB at ankles - cues to avoid ER at hip; UE support on counter    Hip Extension  Right;Left;10 reps;Knee straight;Stengthening    Extension Limitations  yellow TB at ankles, UE support on counter    Other Standing Knee Exercises  B side stepping & fwd monster walk with looped yellow TB at ankles 4 x 10 ft along counter  Knee/Hip Exercises: Seated   Long Arc Quad  Right;15 reps;Weights;Strengthening    Long Arc Quad Weight  2 lbs.    Long [employer] Limitations  + hip adduction ball squeeze    Other Seated Knee/Hip Exercises  R Fitter leg press (2 blue) x 15     Hamstring Curl  Right;10 reps;Strengthening    Hamstring Limitations  yellow TB      Knee/Hip Exercises: Supine   Short Arc Quad Sets  Right;15 reps;Strengthening    Short Arc Quad Sets Limitations  2# + hip adduction ball squeeze with 8" FR under knees    Bridges with [employer]  Both;5 reps;2 sets;Strengthening    Straight Leg Raises  Right;10 reps;Strengthening    Straight Leg Raises Limitations  2#, quad lag evident with last ~3-4 reps    Knee Extension  Right;Left;10 reps;Strengthening    Knee Extension Limitations  alt isometric quad + glute set with heels pushing into peanut ball    Knee Flexion  Both;10 reps;AROM;Strengthening    Knee Flexion Limitations  HS curls with heels on peanut ball    Other Supine Knee/Hip Exercises  Hooklying alt hip BAD/ER with yellow TB x 10                Alicia Reyes Short Term Goals - 07/17/18 1437      Alicia Reyes SHORT TERM GOAL #1   Title  Independent with initial HEP    Status  Achieved        Alicia Reyes Long Term Goals - 07/15/18 1310      Alicia Reyes LONG TERM GOAL #1   Title  Independent with ongoing/advamced HEP    Status  On-going      Alicia Reyes LONG TERM GOAL #2   Title  Patient will improve R knee extension AROM to 0 dg with no quad lag    Status  On-going      Alicia Reyes LONG TERM GOAL #3   Title  R hip and knee strength >/= 4+/5 for improved stability    Status  On-going      Alicia Reyes LONG TERM GOAL #4   Title  Patient will ambulate with normal gait pattern and speed w/o AD to increase safety with community ambulation    Status  On-going      Alicia Reyes LONG TERM GOAL #5   Title  Patient will navigate up/down 5 stairs with good reciprocal pattern to increase eas of access to home    Status  On-going            Plan - 07/19/18 0807    Clinical Impression Statement  Alicia Reyes reporting she feels like her leg is getting stronger and that VMO focused exercises have helped reduce the medial knee pain she was experiencing earlier this week. Alicia Reyes requiring slow progression of resistance due to fatigue with Alicia Reyes requiring some rest breaks during sets. Alicia Reyes declining need for ice and will ice at home as needed.    Rehab Potential  Excellent    Alicia Reyes Treatment/Interventions  Patient/family education;Therapeutic exercise;Therapeutic activities;Functional mobility training;Gait training;Stair training;Neuromuscular re-education;Balance training;Manual techniques;Passive range of motion;Dry needling;Taping;Electrical Stimulation;Cryotherapy;Vasopneumatic Device;Iontophoresis 4mg /ml Dexamethasone;Moist Heat;ADLs/Self Care Home Management    Alicia Reyes Next Visit Plan  LE flexibility; R knee ROM; LE strengthening; Gait training weaning SPC as appropriate; Manual therapy and modalities as indicated    Consulted and Agree with Plan of Care  Patient       Patient will benefit from  skilled therapeutic intervention in order to improve  the following deficits and impairments:  Pain, Decreased strength, Impaired flexibility, Decreased range of motion, Increased muscle spasms, Decreased scar mobility, Decreased mobility, Decreased activity tolerance, Difficulty walking, Abnormal gait, Decreased balance, Decreased endurance  Visit Diagnosis: Acute pain of right knee  Stiffness of right knee, not elsewhere classified  Muscle weakness (generalized)  Other abnormalities of gait and mobility  Difficulty in walking, not elsewhere classified     Problem List Patient Active Problem List   Diagnosis Date Noted  . Primary localized osteoarthritis of knee 06/25/2018  . Anxiety state 06/14/2018  . Hyperlipidemia 06/14/2018  . Primary osteoarthritis of right knee 06/14/2018    Alicia Reyes, Alicia Reyes, Alicia Reyes 07/19/2018, 8:45 AM  [facility] [place] [address]  [address] [place], [place], [address] Phone: [phone]   Fax:  [phone]  Name: Alicia Reyes MRN: [id number] Date of Birth: July 05, 1945

## 2018-07-22 ENCOUNTER — Encounter: Payer: Self-pay | Admitting: Physical Therapy

## 2018-07-22 ENCOUNTER — Ambulatory Visit: Payer: Medicare Other | Admitting: Physical Therapy

## 2018-07-22 DIAGNOSIS — M6281 Muscle weakness (generalized): Secondary | ICD-10-CM

## 2018-07-22 DIAGNOSIS — M25561 Pain in right knee: Secondary | ICD-10-CM

## 2018-07-22 DIAGNOSIS — M25661 Stiffness of right knee, not elsewhere classified: Secondary | ICD-10-CM

## 2018-07-22 DIAGNOSIS — R262 Difficulty in walking, not elsewhere classified: Secondary | ICD-10-CM

## 2018-07-22 DIAGNOSIS — R2689 Other abnormalities of gait and mobility: Secondary | ICD-10-CM

## 2018-07-22 NOTE — Therapy (Signed)
[facility] [address]  [address] [place], [place], [address] Phone: [phone]   Fax:  [phone]  Physical Therapy Treatment  Patient Details  Name: Alicia Reyes MRN: [id number] Date of Birth: [birth year] Referring Provider (PT): Alicia Apley, MD   Encounter Date: 07/22/2018  PT End of Session - 07/22/18 1102    Visit Number  5    Number of Visits  18    Date for PT Re-Evaluation  08/22/18    Authorization Type  Medicare & Aetna    PT Start Time  1102    PT Stop Time  1202    PT Time Calculation (min)  60 min    Activity Tolerance  Patient tolerated treatment well    Behavior During Therapy  [facility] for tasks assessed/performed       Past Medical History:  Diagnosis Date  . Anxiety   . Arthritis   . GERD (gastroesophageal reflux disease)   . Headache    cluster headaches  . Rosacea     Past Surgical History:  Procedure Laterality Date  . ABDOMINAL HYSTERECTOMY    . CARPAL TUNNEL RELEASE     left hand  . CATARACT EXTRACTION, BILATERAL     with lens implant  . CHOLECYSTECTOMY    . DILATION AND CURETTAGE OF UTERUS    . EYE SURGERY     retina surgery -bilateral  . FOOT SURGERY     bilateral feet  . PARTIAL KNEE ARTHROPLASTY Right 06/25/2018   Procedure: UNICOMPARTMENTAL KNEE;  Surgeon: Alicia Apley, MD;  Location: [facility];  Service: Orthopedics;  Laterality: Right;    There were no vitals filed for this visit.  Subjective Assessment - 07/22/18 1105    Subjective  Pt reporting increased stiffness today as well as soreness in L thigh today.    Pertinent History  R partial knee replacement - 06/25/18    Patient Stated Goals  "to improve my balance and make my leg stronger so I can do what I wan to do"    Currently in Pain?  Yes    Pain Score  4     Pain Location  Knee    Pain Orientation  Right    Pain Descriptors / Indicators  Burning    Pain Type  Acute pain;Surgical pain    Pain Frequency   Constant         [facility] PT Assessment - 07/22/18 1102      AROM   Right Knee Extension  0   2-3 dg quad lag with SLR   Right Knee Flexion  127                   [facility] Adult PT Treatment/Exercise - 07/22/18 1102      Exercises   Exercises  Knee/Hip      Knee/Hip Exercises: Aerobic   Recumbent Bike  L1 x 6 min      Knee/Hip Exercises: Standing   Terminal Knee Extension  Right;20 reps;Theraband;Strengthening    Theraband Level (Terminal Knee Extension)  Level 4 (Blue)    Terminal Knee Extension Limitations  cues for quad & glute set    Lateral Step Up  Right;Left;10 reps;Step Height: 6";Hand Hold: 2    Forward Step Up  Right;15 reps;Step Height: 6";Hand Hold: 1    Step Down  Right;10 reps;Step Height: 6";Hand Hold: 2    Step Down Limitations  lateral eccentric lowering with light heel touch -  pt wanting to reach with toes rather than heel    Functional Squat  15 reps;3 seconds    Functional Squat Limitations  TRX    SLS with Vectors  R SLS with L toe tap to 3 cones x 10; light single UE support on counter    Other Standing Knee Exercises  R fwd step-up and over on 4" step x 20      Knee/Hip Exercises: Seated   Clamshell with TheraBand  Red   alt hip ABD/ER x 10   Hamstring Curl  Right;15 reps;Strengthening    Hamstring Limitations  red TB      Modalities   Modalities  Vasopneumatic      Vasopneumatic   Number Minutes Vasopneumatic   10 minutes    Vasopnuematic Location   Knee    Vasopneumatic Pressure  Low    Vasopneumatic Temperature   lowest               PT Short Term Goals - 07/17/18 1437      PT SHORT TERM GOAL #1   Title  Independent with initial HEP    Status  Achieved        PT Long Term Goals - 07/15/18 1310      PT LONG TERM GOAL #1   Title  Independent with ongoing/advamced HEP    Status  On-going      PT LONG TERM GOAL #2   Title  Patient will improve R knee extension AROM to 0 dg with no quad lag    Status  On-going       PT LONG TERM GOAL #3   Title  R hip and knee strength >/= 4+/5 for improved stability    Status  On-going      PT LONG TERM GOAL #4   Title  Patient will ambulate with normal gait pattern and speed w/o AD to increase safety with community ambulation    Status  On-going      PT LONG TERM GOAL #5   Title  Patient will navigate up/down 5 stairs with good reciprocal pattern to increase eas of access to home    Status  On-going            Plan - 07/22/18 1108    Clinical Impression Statement  [name] reporting intermittent pain in R knee along with some hypersensitivity to touch - discussed desensitization techniques with progressive textures to reduce hypersensitivity. Steri-strips remain partially intact with appparent scabbing still present under strips, therefore unable to initiate scar mobilization. Progressed standing exercise with step-ups and vector SLS with continued fatigue noted requiring intermittent seated rest breaks. ROM progressing well with able to acheive full R knee extension today and flexion w/in 5 dg of non-surgical leg - AROM 0-127 dg.    Rehab Potential  Excellent    PT Treatment/Interventions  Patient/family education;Therapeutic exercise;Therapeutic activities;Functional mobility training;Gait training;Stair training;Neuromuscular re-education;Balance training;Manual techniques;Passive range of motion;Dry needling;Taping;Electrical Stimulation;Cryotherapy;Vasopneumatic Device;Iontophoresis 4mg /ml Dexamethasone;Moist Heat;ADLs/Self Care Home Management    PT Next Visit Plan  LE flexibility; R knee ROM; LE strengthening; Gait training weaning SPC as appropriate; Manual therapy and modalities as indicated    Consulted and Agree with Plan of Care  Patient       Patient will benefit from skilled therapeutic intervention in order to improve the following deficits and impairments:  Pain, Decreased strength, Impaired flexibility, Decreased range of motion, Increased muscle  spasms, Decreased scar mobility, Decreased mobility, Decreased activity tolerance, Difficulty walking,  Abnormal gait, Decreased balance, Decreased endurance  Visit Diagnosis: Acute pain of right knee  Stiffness of right knee, not elsewhere classified  Muscle weakness (generalized)  Other abnormalities of gait and mobility  Difficulty in walking, not elsewhere classified     Problem List Patient Active Problem List   Diagnosis Date Noted  . Primary localized osteoarthritis of knee 06/25/2018  . Anxiety state 06/14/2018  . Hyperlipidemia 06/14/2018  . Primary osteoarthritis of right knee 06/14/2018    Marry GuanJoAnne M Bryant Lipps, PT, MPT 07/22/2018, 12:08 PM  [facility] [address]  [address] [place], [place], [address] Phone: [phone]   Fax:  [phone]  Name: Leta BaptistJohnnie Deroo MRN: [id number] Date of Birth: February 21, 1946

## 2018-07-24 ENCOUNTER — Encounter: Payer: Self-pay | Admitting: Physical Therapy

## 2018-07-24 ENCOUNTER — Ambulatory Visit: Payer: Medicare Other | Admitting: Physical Therapy

## 2018-07-24 DIAGNOSIS — R262 Difficulty in walking, not elsewhere classified: Secondary | ICD-10-CM

## 2018-07-24 DIAGNOSIS — M25561 Pain in right knee: Secondary | ICD-10-CM | POA: Diagnosis not present

## 2018-07-24 DIAGNOSIS — R2689 Other abnormalities of gait and mobility: Secondary | ICD-10-CM

## 2018-07-24 DIAGNOSIS — M6281 Muscle weakness (generalized): Secondary | ICD-10-CM

## 2018-07-24 DIAGNOSIS — M25661 Stiffness of right knee, not elsewhere classified: Secondary | ICD-10-CM

## 2018-07-24 NOTE — Therapy (Addendum)
[facility] [address]  [address] [place], [place], [address] Phone: [phone]   Fax:  [phone]  Physical Therapy Treatment  Patient Details  Name: Alicia Reyes MRN: [id number] Date of Birth: [birth year] Referring Provider (PT): Sheral Apleyimothy D Murphy, MD   Encounter Date: 07/24/2018  PT End of Session - 07/24/18 1059    Visit Number  6    Number of Visits  18    Date for PT Re-Evaluation  08/22/18    Authorization Type  Medicare & Aetna    PT Start Time  1059    PT Stop Time  1202    PT Time Calculation (min)  63 min    Activity Tolerance  Patient tolerated treatment well    Behavior During Therapy  [facility] for tasks assessed/performed       Past Medical History:  Diagnosis Date  . Anxiety   . Arthritis   . GERD (gastroesophageal reflux disease)   . Headache    cluster headaches  . Rosacea     Past Surgical History:  Procedure Laterality Date  . ABDOMINAL HYSTERECTOMY    . CARPAL TUNNEL RELEASE     left hand  . CATARACT EXTRACTION, BILATERAL     with lens implant  . CHOLECYSTECTOMY    . DILATION AND CURETTAGE OF UTERUS    . EYE SURGERY     retina surgery -bilateral  . FOOT SURGERY     bilateral feet  . PARTIAL KNEE ARTHROPLASTY Right 06/25/2018   Procedure: UNICOMPARTMENTAL KNEE;  Surgeon: Sheral ApleyMurphy, Timothy D, MD;  Location: [facility];  Service: Orthopedics;  Laterality: Right;    There were no vitals filed for this visit.  Subjective Assessment - 07/24/18 1100    Subjective  Pt reports she feels like the soft tissue at her knee is irritated today.    Pertinent History  R partial knee replacement - 06/25/18    Patient Stated Goals  "to improve my balance and make my leg stronger so I can do what I wan to do"    Currently in Pain?  Yes    Pain Score  4     Pain Location  Knee    Pain Orientation  Right    Pain Descriptors / Indicators  --   "very sensitive"   Pain Type  Acute pain;Surgical pain                        [facility] Adult PT Treatment/Exercise - 07/24/18 1059      Self-Care   Self-Care  Scar Mobilizations    Scar Mobilizations  Instruction in R knee incisional scar massage avoiding pressure on top of remaining steri-strips as well any potential underlying scabbing as steri-strips fall off.      Knee/Hip Exercises: Aerobic   Nustep  L4 x 6 min (LE only)      Knee/Hip Exercises: Supine   Short Arc Quad Sets  Right;10 reps;2 sets;Strengthening    Short Arc Quad Sets Limitations  2#    Bridges with Harley-DavidsonBall Squeeze  Both;5 reps;2 sets;Strengthening    Straight Leg Raises  Right;10 reps;Strengthening    Straight Leg Raises Limitations  2#, reduced to 1# at pt request    Patellar Mobs  Instructed pt in 4 directional R knee patellar mobs with pt performing return demonstration in long sitting.    Knee Extension  Both;5 reps;2 sets;Strengthening    Knee Extension Limitations  isometric  quad set + straight leg bridge     Knee Flexion  Both;10 reps;2 sets    Knee Flexion Limitations  HS curls with heels on peanut ball      Modalities   Modalities  Vasopneumatic;Electrical Stimulation      Electrical Stimulation   Electrical Stimulation Location  R anterior knee    Electrical Stimulation Action  IFC    Electrical Stimulation Parameters  80-150 Hz, intensity to pt tolerance x 15 min    Electrical Stimulation Goals  Pain   hypersensitivity     Vasopneumatic   Number Minutes Vasopneumatic   15 minutes    Vasopnuematic Location   Knee    Vasopneumatic Pressure  Low    Vasopneumatic Temperature   lowest      Manual Therapy   Manual Therapy  Joint mobilization;Soft tissue mobilization    Manual therapy comments  long sitting    Joint Mobilization  R knee patellar mobs - all directions    Soft tissue mobilization  R knee incisional scar massage             PT Education - 07/24/18 1131    Education Details  Incisional scar tissue massage & patellar  mobilization    Person(s) Educated  Patient    Methods  Explanation;Demonstration;Handout    Comprehension  Verbalized understanding;Returned demonstration;Need further instruction       PT Short Term Goals - 07/17/18 1437      PT SHORT TERM GOAL #1   Title  Independent with initial HEP    Status  Achieved        PT Long Term Goals - 07/15/18 1310      PT LONG TERM GOAL #1   Title  Independent with ongoing/advamced HEP    Status  On-going      PT LONG TERM GOAL #2   Title  Patient will improve R knee extension AROM to 0 dg with no quad lag    Status  On-going      PT LONG TERM GOAL #3   Title  R hip and knee strength >/= 4+/5 for improved stability    Status  On-going      PT LONG TERM GOAL #4   Title  Patient will ambulate with normal gait pattern and speed w/o AD to increase safety with community ambulation    Status  On-going      PT LONG TERM GOAL #5   Title  Patient will navigate up/down 5 stairs with good reciprocal pattern to increase eas of access to home    Status  On-going            Plan - 07/24/18 1103    Clinical Impression Statement  Alicia Reyes arriving to PT today c/o increased hypersensitivity and soft tisse pain in her anterior knee - slight erythema and increased temp noted but no overt signs of infection. Initiated scar tissue mobilization as all but most distal 2 steri-strips have fallen off and incision intact w/o any remaining scabbing - pt noting reduction in pain and hypersensitivity with this, therefore provided instruction in home performance. Following scar mobilization and patellar mobs pt reporting better tolerance for exercise and mobility/ambulation. Treatment concluded with estim and vaso to further reduce pain, sensitivity and edema.    Rehab Potential  Excellent    PT Treatment/Interventions  Patient/family education;Therapeutic exercise;Therapeutic activities;Functional mobility training;Gait training;Stair training;Neuromuscular  re-education;Balance training;Manual techniques;Passive range of motion;Dry needling;Taping;Electrical Stimulation;Cryotherapy;Vasopneumatic Device;Iontophoresis 4mg /ml Dexamethasone;Moist Heat;ADLs/Self Care Home Management  PT Next Visit Plan  LE flexibility; R knee ROM; LE strengthening; Gait training weaning SPC as appropriate; Manual therapy and modalities as indicated    Consulted and Agree with Plan of Care  Patient       Patient will benefit from skilled therapeutic intervention in order to improve the following deficits and impairments:  Pain, Decreased strength, Impaired flexibility, Decreased range of motion, Increased muscle spasms, Decreased scar mobility, Decreased mobility, Decreased activity tolerance, Difficulty walking, Abnormal gait, Decreased balance, Decreased endurance  Visit Diagnosis: Acute pain of right knee  Stiffness of right knee, not elsewhere classified  Muscle weakness (generalized)  Other abnormalities of gait and mobility  Difficulty in walking, not elsewhere classified     Problem List Patient Active Problem List   Diagnosis Date Noted  . Primary localized osteoarthritis of knee 06/25/2018  . Anxiety state 06/14/2018  . Hyperlipidemia 06/14/2018  . Primary osteoarthritis of right knee 06/14/2018    Marry Guan, PT, MPT 07/24/2018, 1:12 PM  [facility] [address]  [address] [place], [place], [address] Phone: [phone]   Fax:  [phone]  Name: Alicia Reyes MRN: [id number] Date of Birth: Mar 10, 1946

## 2018-07-26 ENCOUNTER — Ambulatory Visit: Payer: Medicare Other | Admitting: Physical Therapy

## 2018-07-26 ENCOUNTER — Encounter: Payer: Self-pay | Admitting: Physical Therapy

## 2018-07-26 DIAGNOSIS — M25561 Pain in right knee: Secondary | ICD-10-CM

## 2018-07-26 DIAGNOSIS — M6281 Muscle weakness (generalized): Secondary | ICD-10-CM

## 2018-07-26 DIAGNOSIS — R262 Difficulty in walking, not elsewhere classified: Secondary | ICD-10-CM

## 2018-07-26 DIAGNOSIS — R2689 Other abnormalities of gait and mobility: Secondary | ICD-10-CM

## 2018-07-26 DIAGNOSIS — M25661 Stiffness of right knee, not elsewhere classified: Secondary | ICD-10-CM

## 2018-07-26 NOTE — Therapy (Signed)
[facility] [address]  [address] [place], [place], [address] Phone: [phone]   Fax:  [phone]  Physical Therapy Treatment  Patient Details  Name: Alicia Reyes MRN: [id number] Date of Birth: [birth year] Referring Provider (PT): Sheral Apley, MD   Encounter Date: 07/26/2018  PT End of Session - 07/26/18 1010    Visit Number  7    Number of Visits  18    Date for PT Re-Evaluation  08/22/18    Authorization Type  Medicare & Aetna    PT Start Time  1010    PT Stop Time  1103    PT Time Calculation (min)  53 min    Activity Tolerance  Patient tolerated treatment well    Behavior During Therapy  [facility] for tasks assessed/performed       Past Medical History:  Diagnosis Date  . Anxiety   . Arthritis   . GERD (gastroesophageal reflux disease)   . Headache    cluster headaches  . Rosacea     Past Surgical History:  Procedure Laterality Date  . ABDOMINAL HYSTERECTOMY    . CARPAL TUNNEL RELEASE     left hand  . CATARACT EXTRACTION, BILATERAL     with lens implant  . CHOLECYSTECTOMY    . DILATION AND CURETTAGE OF UTERUS    . EYE SURGERY     retina surgery -bilateral  . FOOT SURGERY     bilateral feet  . PARTIAL KNEE ARTHROPLASTY Right 06/25/2018   Procedure: UNICOMPARTMENTAL KNEE;  Surgeon: Sheral Apley, MD;  Location: [facility];  Service: Orthopedics;  Laterality: Right;    There were no vitals filed for this visit.  Subjective Assessment - 07/26/18 1012    Subjective  Pt reporting sensitivity improved after last session. Did have increased pain yesterday afer being on her feet more and woke up stiffer this morning, but better after walking around some.    Pertinent History  R partial knee replacement - 06/25/18    Patient Stated Goals  "to improve my balance and make my leg stronger so I can do what I wan to do"    Currently in Pain?  Yes    Pain Score  2     Pain Location  Knee    Pain Orientation   Right    Pain Descriptors / Indicators  Tightness   "stiffness"   Pain Type  Acute pain;Surgical pain    Pain Frequency  Intermittent                       [facility] Adult PT Treatment/Exercise - 07/26/18 1010      Exercises   Exercises  Knee/Hip      Knee/Hip Exercises: Stretches   Gastroc Stretch  Right;30 seconds;2 reps    Gastroc Stretch Limitations  standing at wall & negative heel over edge of step      Knee/Hip Exercises: Aerobic   Recumbent Bike  L2 x 6 min      Knee/Hip Exercises: Machines for Strengthening   Cybex Knee Extension  B LE 5# x10; B con/R ecc 5# x10    Cybex Knee Flexion  B LE 15# x10; B con/R ecc 10# x10      Knee/Hip Exercises: Standing   Hip Flexion  Right;Left;10 reps;Knee straight;Stengthening    Hip Flexion Limitations  yellow TB at ankle, UE support on back of chair    Hip ADduction  Right;Left;10 reps;Strengthening    Hip ADduction Limitations  yellow TB at ankle, UE support on back of chair    Hip Abduction  Right;Left;10 reps;Knee straight;Stengthening    Abduction Limitations  yellow TB at ankle, UE support on back of chair    Hip Extension  Right;Left;10 reps;Knee straight;Stengthening    Extension Limitations  yellow TB at ankle, UE support on back of chair    Step Down  Right;15 reps;Step Height: 4";Hand Hold: 2    Step Down Limitations  lateral eccentric lowering with light heel touch      Modalities   Modalities  Vasopneumatic;Electrical Stimulation      Vasopneumatic   Number Minutes Vasopneumatic   10 minutes    Vasopnuematic Location   Knee    Vasopneumatic Pressure  Low    Vasopneumatic Temperature   lowest      Manual Therapy   Manual Therapy  Other (comment)    Other Manual Therapy  Reviewed technique for home self-performance of R patellar mobs & incisional scar massge               PT Short Term Goals - 07/17/18 1437      PT SHORT TERM GOAL #1   Title  Independent with initial HEP    Status   Achieved        PT Long Term Goals - 07/15/18 1310      PT LONG TERM GOAL #1   Title  Independent with ongoing/advamced HEP    Status  On-going      PT LONG TERM GOAL #2   Title  Patient will improve R knee extension AROM to 0 dg with no quad lag    Status  On-going      PT LONG TERM GOAL #3   Title  R hip and knee strength >/= 4+/5 for improved stability    Status  On-going      PT LONG TERM GOAL #4   Title  Patient will ambulate with normal gait pattern and speed w/o AD to increase safety with community ambulation    Status  On-going      PT LONG TERM GOAL #5   Title  Patient will navigate up/down 5 stairs with good reciprocal pattern to increase eas of access to home    Status  On-going            Plan - 07/26/18 1015    Clinical Impression Statement  Alicia Reyes reporting reduction in R knee sensitivity since last session but states she was hesistant to attempt patellar mobs & incisional scar massge at home, therefore reviewed technique for both reassuring pt that she will not harm anything and that it should help with sensitivity and feeling of restriction with knee motion. Also reviewed options for calf and HS stretches to alleviate tightness in the back of the knee. Introduced weight machines but weights kept very low due to c/o fatigue with only 10 reps.    Rehab Potential  Excellent    PT Treatment/Interventions  Patient/family education;Therapeutic exercise;Therapeutic activities;Functional mobility training;Gait training;Stair training;Neuromuscular re-education;Balance training;Manual techniques;Passive range of motion;Dry needling;Taping;Electrical Stimulation;Cryotherapy;Vasopneumatic Device;Iontophoresis 4mg /ml Dexamethasone;Moist Heat;ADLs/Self Care Home Management    PT Next Visit Plan  LE flexibility; R knee ROM; LE strengthening; Gait training weaning SPC as appropriate; Manual therapy and modalities as indicated    Consulted and Agree with Plan of Care  Patient        Patient will benefit from skilled therapeutic intervention in order to  improve the following deficits and impairments:  Pain, Decreased strength, Impaired flexibility, Decreased range of motion, Increased muscle spasms, Decreased scar mobility, Decreased mobility, Decreased activity tolerance, Difficulty walking, Abnormal gait, Decreased balance, Decreased endurance  Visit Diagnosis: Acute pain of right knee  Stiffness of right knee, not elsewhere classified  Muscle weakness (generalized)  Other abnormalities of gait and mobility  Difficulty in walking, not elsewhere classified     Problem List Patient Active Problem List   Diagnosis Date Noted  . Primary localized osteoarthritis of knee 06/25/2018  . Anxiety state 06/14/2018  . Hyperlipidemia 06/14/2018  . Primary osteoarthritis of right knee 06/14/2018    Marry Guan, PT, MPT 07/26/2018, 1:06 PM  [facility] [address]  [address] [place], [place], [address] Phone: [phone]   Fax:  [phone]  Name: Alicia Reyes MRN: [id number] Date of Birth: 05-27-1946

## 2018-07-29 ENCOUNTER — Ambulatory Visit: Payer: Medicare Other

## 2018-07-29 DIAGNOSIS — R2689 Other abnormalities of gait and mobility: Secondary | ICD-10-CM

## 2018-07-29 DIAGNOSIS — M25561 Pain in right knee: Secondary | ICD-10-CM

## 2018-07-29 DIAGNOSIS — R262 Difficulty in walking, not elsewhere classified: Secondary | ICD-10-CM

## 2018-07-29 DIAGNOSIS — M25661 Stiffness of right knee, not elsewhere classified: Secondary | ICD-10-CM

## 2018-07-29 DIAGNOSIS — M6281 Muscle weakness (generalized): Secondary | ICD-10-CM

## 2018-07-29 NOTE — Therapy (Signed)
[facility] [address]  [address] [place], [place], [address] Phone: [phone]   Fax:  [phone]  Physical Therapy Treatment  Patient Details  Name: Alicia Reyes MRN: [id number] Date of Birth: 11-29-1945 Referring Provider (PT): Sheral Apley, MD   Encounter Date: 07/29/2018  PT End of Session - 07/29/18 1056    Visit Number  8    Number of Visits  18    Date for PT Re-Evaluation  08/22/18    Authorization Type  Medicare & Aetna    PT Start Time  1049    PT Stop Time  1150    PT Time Calculation (min)  61 min    Activity Tolerance  Patient tolerated treatment well    Behavior During Therapy  [facility] for tasks assessed/performed       Past Medical History:  Diagnosis Date  . Anxiety   . Arthritis   . GERD (gastroesophageal reflux disease)   . Headache    cluster headaches  . Rosacea     Past Surgical History:  Procedure Laterality Date  . ABDOMINAL HYSTERECTOMY    . CARPAL TUNNEL RELEASE     left hand  . CATARACT EXTRACTION, BILATERAL     with lens implant  . CHOLECYSTECTOMY    . DILATION AND CURETTAGE OF UTERUS    . EYE SURGERY     retina surgery -bilateral  . FOOT SURGERY     bilateral feet  . PARTIAL KNEE ARTHROPLASTY Right 06/25/2018   Procedure: UNICOMPARTMENTAL KNEE;  Surgeon: Sheral Apley, MD;  Location: [facility];  Service: Orthopedics;  Laterality: Right;    There were no vitals filed for this visit.  Subjective Assessment - 07/29/18 1054    Subjective  Pt. reporting she feels she is not performing "knee cap pushes right".  Denies soreness after last session.      Pertinent History  R partial knee replacement - 06/25/18    Patient Stated Goals  "to improve my balance and make my leg stronger so I can do what I wan to do"    Currently in Pain?  Yes    Pain Score  1     Pain Location  Knee    Pain Orientation  Right    Pain Descriptors / Indicators  Tightness    Pain Type  Acute  pain;Surgical pain    Pain Onset  1 to 4 weeks ago    Pain Frequency  Intermittent    Multiple Pain Sites  No                       [facility] Adult PT Treatment/Exercise - 07/29/18 1101      Knee/Hip Exercises: Aerobic   Recumbent Bike  L2 x 7 min      Knee/Hip Exercises: Machines for Strengthening   Cybex Leg Press  B LE's 20# x 15 reps       Knee/Hip Exercises: Standing   Forward Step Up  Right;15 reps;Hand Hold: 1;Step Height: 6"    Forward Step Up Limitations  light UE support on counter     Wall Squat  3 seconds   x 12 reps      Knee/Hip Exercises: Seated   Other Seated Knee/Hip Exercises  R Fitter leg press (1 black band) x 15     Sit to Sand  10 reps;with UE support   pushing off from knees with B UE  Knee/Hip Exercises: Supine   Bridges with Clamshell  10 reps   hip abd/ER isometrics into red looped TB at knees    Straight Leg Raises  Right   x 12 reps    Straight Leg Raises Limitations  1#      Knee/Hip Exercises: Prone   Hip Extension  Right;10 reps      Vasopneumatic   Number Minutes Vasopneumatic   10 minutes    Vasopnuematic Location   Knee   R   Vasopneumatic Pressure  Low    Vasopneumatic Temperature   lowest      Manual Therapy   Joint Mobilization  R knee patellar mobs - all directions   with pt. instruction on proper effort and hand placement    Soft tissue mobilization  R knee incisional scar massage - instruction for pt. to avoid unhealed inferior incision area    Instruction for pt. to perform at home and avoid inferior in              PT Short Term Goals - 07/17/18 1437      PT SHORT TERM GOAL #1   Title  Independent with initial HEP    Status  Achieved        PT Long Term Goals - 07/15/18 1310      PT LONG TERM GOAL #1   Title  Independent with ongoing/advamced HEP    Status  On-going      PT LONG TERM GOAL #2   Title  Patient will improve R knee extension AROM to 0 dg with no quad lag    Status  On-going       PT LONG TERM GOAL #3   Title  R hip and knee strength >/= 4+/5 for improved stability    Status  On-going      PT LONG TERM GOAL #4   Title  Patient will ambulate with normal gait pattern and speed w/o AD to increase safety with community ambulation    Status  On-going      PT LONG TERM GOAL #5   Title  Patient will navigate up/down 5 stairs with good reciprocal pattern to increase eas of access to home    Status  On-going            Plan - 07/29/18 1056    Clinical Impression Statement  Pt. reporting she has been hesitant to perform scar massage and patellar mobs over weekend due to not sure if she is doing them right.  Briefly reviewed proper technique with pt. today with pt. verbalizing understanding.  Pt. tolerated progression of wall squat, lateral step-up, and addition of machine leg press well today without increased R knee pain.  Required cueing for proper ROM with bridge activity today and tends to require encouragement from therapist for full effort with therex strengthening activities.  Pt. denies muscular soreness after therapy visits.  Ended visit with ice/compression to R knee to reduce post-exercise soreness and swelling.  Pt. demonstrating visible improvement in gait speed and mechanics without AD since starting therapy demonstrating progress toward LTG #4 today.  Will continue to progress toward goals.      Rehab Potential  Excellent    PT Treatment/Interventions  Patient/family education;Therapeutic exercise;Therapeutic activities;Functional mobility training;Gait training;Stair training;Neuromuscular re-education;Balance training;Manual techniques;Passive range of motion;Dry needling;Taping;Electrical Stimulation;Cryotherapy;Vasopneumatic Device;Iontophoresis 4mg /ml Dexamethasone;Moist Heat;ADLs/Self Care Home Management    PT Next Visit Plan  LE flexibility; R knee ROM; LE strengthening; Gait training weaning SPC as appropriate;  Manual therapy and modalities as  indicated    Consulted and Agree with Plan of Care  Patient       Patient will benefit from skilled therapeutic intervention in order to improve the following deficits and impairments:  Pain, Decreased strength, Impaired flexibility, Decreased range of motion, Increased muscle spasms, Decreased scar mobility, Decreased mobility, Decreased activity tolerance, Difficulty walking, Abnormal gait, Decreased balance, Decreased endurance  Visit Diagnosis: Acute pain of right knee  Stiffness of right knee, not elsewhere classified  Muscle weakness (generalized)  Other abnormalities of gait and mobility  Difficulty in walking, not elsewhere classified     Problem List Patient Active Problem List   Diagnosis Date Noted  . Primary localized osteoarthritis of knee 06/25/2018  . Anxiety state 06/14/2018  . Hyperlipidemia 06/14/2018  . Primary osteoarthritis of right knee 06/14/2018    Kermit BaloMicah Adaia Matthies, PTA 07/29/18 12:04 PM   [facility] [address]  [address] [place], [place], [address] Phone: [phone]   Fax:  [phone]  Name: Leta BaptistJohnnie Fabiano MRN: [id number] Date of Birth: 1945/08/12

## 2018-08-01 ENCOUNTER — Ambulatory Visit: Payer: Medicare Other

## 2018-08-01 DIAGNOSIS — M25561 Pain in right knee: Secondary | ICD-10-CM | POA: Diagnosis not present

## 2018-08-01 DIAGNOSIS — R262 Difficulty in walking, not elsewhere classified: Secondary | ICD-10-CM

## 2018-08-01 DIAGNOSIS — M25661 Stiffness of right knee, not elsewhere classified: Secondary | ICD-10-CM

## 2018-08-01 DIAGNOSIS — M6281 Muscle weakness (generalized): Secondary | ICD-10-CM

## 2018-08-01 DIAGNOSIS — R2689 Other abnormalities of gait and mobility: Secondary | ICD-10-CM

## 2018-08-01 NOTE — Therapy (Signed)
[facility] [address]  [address] [place], [place], [address] Phone: [phone]   Fax:  [phone]  Physical Therapy Treatment  Patient Details  Name: Alicia Reyes MRN: [id number] Date of Birth: Mar 24, 1946 Referring Provider (PT): Sheral Apley, MD   Encounter Date: 08/01/2018  PT End of Session - 08/01/18 1307    Visit Number  9    Number of Visits  18    Date for PT Re-Evaluation  08/22/18    Authorization Type  Medicare & Aetna    PT Start Time  1306    PT Stop Time  1354    PT Time Calculation (min)  48 min    Activity Tolerance  Patient tolerated treatment well    Behavior During Therapy  [facility] for tasks assessed/performed       Past Medical History:  Diagnosis Date  . Anxiety   . Arthritis   . GERD (gastroesophageal reflux disease)   . Headache    cluster headaches  . Rosacea     Past Surgical History:  Procedure Laterality Date  . ABDOMINAL HYSTERECTOMY    . CARPAL TUNNEL RELEASE     left hand  . CATARACT EXTRACTION, BILATERAL     with lens implant  . CHOLECYSTECTOMY    . DILATION AND CURETTAGE OF UTERUS    . EYE SURGERY     retina surgery -bilateral  . FOOT SURGERY     bilateral feet  . PARTIAL KNEE ARTHROPLASTY Right 06/25/2018   Procedure: UNICOMPARTMENTAL KNEE;  Surgeon: Sheral Apley, MD;  Location: [facility];  Service: Orthopedics;  Laterality: Right;    There were no vitals filed for this visit.  Subjective Assessment - 08/01/18 1307    Subjective  Pt. reporting reporting some "stinging" pain at R knee last night which woke her up a few times.      Pertinent History  R partial knee replacement - 06/25/18    Patient Stated Goals  "to improve my balance and make my leg stronger so I can do what I wan to do"    Currently in Pain?  Yes    Pain Score  1     Pain Location  Knee    Pain Orientation  Right    Pain Descriptors / Indicators  Aching    Pain Type  Acute pain;Surgical pain     Multiple Pain Sites  No                       [facility] Adult PT Treatment/Exercise - 08/01/18 1313      Knee/Hip Exercises: Stretches   Passive Hamstring Stretch  Right;30 seconds;1 rep    Passive Hamstring Stretch Limitations  supine with strap + DF for gastroc stretch    Quad Stretch  Right;1 rep;30 seconds    Quad Stretch Limitations  strap     Hip Flexor Stretch  Right;1 rep;30 seconds    Hip Flexor Stretch Limitations  strap       Knee/Hip Exercises: Aerobic   Recumbent Bike  L2 x 7 min      Knee/Hip Exercises: Standing   Knee Flexion  Right;15 reps    Knee Flexion Limitations  yellow looped TB at R ankle and under L foot - chair support    Lateral Step Up  Right;10 reps;Hand Hold: 2;Step Height: 6"    Lateral Step Up Limitations  Cues for slow eccentric lowering  Forward Step Up  Right;10 reps;Step Height: 8";Hand Hold: 1    Forward Step Up Limitations  Cues for proper sequencing     Functional Squat  15 reps;3 seconds    Functional Squat Limitations  TM rail support      Knee/Hip Exercises: Supine   Bridges  15 reps;Both;Strengthening    Straight Leg Raises  Right;15 reps    Straight Leg Raises Limitations  1#      Knee/Hip Exercises: Sidelying   Hip ABduction  Right;10 reps    Hip ADduction  Right;10 reps    Clams  R clam shell (with no resistance) x 10 reps       Vasopneumatic   Number Minutes Vasopneumatic   10 minutes    Vasopnuematic Location   Knee    Vasopneumatic Pressure  Low    Vasopneumatic Temperature   lowest             PT Education - 08/01/18 1357    Education Details  HEP update; R SLS x 15 sec, bridge     Person(s) Educated  Patient    Methods  Explanation;Demonstration;Verbal cues;Handout    Comprehension  Verbalized understanding;Returned demonstration;Verbal cues required;Need further instruction       PT Short Term Goals - 07/17/18 1437      PT SHORT TERM GOAL #1   Title  Independent with initial HEP     Status  Achieved        PT Long Term Goals - 07/15/18 1310      PT LONG TERM GOAL #1   Title  Independent with ongoing/advamced HEP    Status  On-going      PT LONG TERM GOAL #2   Title  Patient will improve R knee extension AROM to 0 dg with no quad lag    Status  On-going      PT LONG TERM GOAL #3   Title  R hip and knee strength >/= 4+/5 for improved stability    Status  On-going      PT LONG TERM GOAL #4   Title  Patient will ambulate with normal gait pattern and speed w/o AD to increase safety with community ambulation    Status  On-going      PT LONG TERM GOAL #5   Title  Patient will navigate up/down 5 stairs with good reciprocal pattern to increase eas of access to home    Status  On-going            Plan - 08/01/18 1358    Clinical Impression Statement  Alicia Reyes admitting to partial adherence to HEP.  Encouraged pt. to perform full HEP for full benefit from therapy.  Tolerated 8" lateral and forward step up well today however does require cueing for slow eccentric lowering.  Progressing toward LTG #5.  Ended visit with ice/compression to R knee to reduce post-exercise soreness and swelling.  HEP updated.      Rehab Potential  Excellent    PT Treatment/Interventions  Patient/family education;Therapeutic exercise;Therapeutic activities;Functional mobility training;Gait training;Stair training;Neuromuscular re-education;Balance training;Manual techniques;Passive range of motion;Dry needling;Taping;Electrical Stimulation;Cryotherapy;Vasopneumatic Device;Iontophoresis 4mg /ml Dexamethasone;Moist Heat;ADLs/Self Care Home Management    PT Next Visit Plan  LE flexibility; R knee ROM; LE strengthening; Gait training weaning SPC as appropriate; Manual therapy and modalities as indicated    Consulted and Agree with Plan of Care  Patient       Patient will benefit from skilled therapeutic intervention in order to improve the following deficits  and impairments:  Pain, Decreased  strength, Impaired flexibility, Decreased range of motion, Increased muscle spasms, Decreased scar mobility, Decreased mobility, Decreased activity tolerance, Difficulty walking, Abnormal gait, Decreased balance, Decreased endurance  Visit Diagnosis: Acute pain of right knee  Stiffness of right knee, not elsewhere classified  Muscle weakness (generalized)  Other abnormalities of gait and mobility  Difficulty in walking, not elsewhere classified     Problem List Patient Active Problem List   Diagnosis Date Noted  . Primary localized osteoarthritis of knee 06/25/2018  . Anxiety state 06/14/2018  . Hyperlipidemia 06/14/2018  . Primary osteoarthritis of right knee 06/14/2018    Kermit BaloMicah Lasheba Stevens, PTA 08/01/18 2:09 PM   [facility] [place] [address]  [address] [place], [place], [address] Phone: [phone]   Fax:  [phone]  Name: Alicia BaptistJohnnie Reyes MRN: [id number] Date of Birth: 09-Oct-1945

## 2018-08-05 ENCOUNTER — Ambulatory Visit: Payer: Medicare Other

## 2018-08-05 DIAGNOSIS — M25561 Pain in right knee: Secondary | ICD-10-CM

## 2018-08-05 DIAGNOSIS — R2689 Other abnormalities of gait and mobility: Secondary | ICD-10-CM

## 2018-08-05 DIAGNOSIS — M6281 Muscle weakness (generalized): Secondary | ICD-10-CM

## 2018-08-05 DIAGNOSIS — M25661 Stiffness of right knee, not elsewhere classified: Secondary | ICD-10-CM

## 2018-08-05 DIAGNOSIS — R262 Difficulty in walking, not elsewhere classified: Secondary | ICD-10-CM

## 2018-08-05 NOTE — Therapy (Signed)
[facility] [place] [address]  [place], [place], [address] Phone: [phone]   Fax:  [phone]  Physical Therapy Treatment  Patient Details  Name: Alicia Reyes MRN: [id number] Date of Birth: 12/08/1945 Referring Provider (PT): Renette Butters, MD  Progress Note All treatment performed day of session   Reporting Period 07/11/2018 to 08/05/2018  See note below for Objective Data and Assessment of Progress/Goals.     Encounter Date: 08/05/2018  PT End of Session - 08/05/18 1328    Visit Number  10    Number of Visits  18    Date for PT Re-Evaluation  08/22/18    Authorization Type  Medicare & Aetna    PT Start Time  1314    PT Stop Time  1359    PT Time Calculation (min)  45 min    Activity Tolerance  Patient tolerated treatment well    Behavior During Therapy  [facility] for tasks assessed/performed       Past Medical History:  Diagnosis Date  . Anxiety   . Arthritis   . GERD (gastroesophageal reflux disease)   . Headache    cluster headaches  . Rosacea     Past Surgical History:  Procedure Laterality Date  . ABDOMINAL HYSTERECTOMY    . CARPAL TUNNEL RELEASE     left hand  . CATARACT EXTRACTION, BILATERAL     with lens implant  . CHOLECYSTECTOMY    . DILATION AND CURETTAGE OF UTERUS    . EYE SURGERY     retina surgery -bilateral  . FOOT SURGERY     bilateral feet  . PARTIAL KNEE ARTHROPLASTY Right 06/25/2018   Procedure: UNICOMPARTMENTAL KNEE;  Surgeon: Renette Butters, MD;  Location: [facility];  Service: Orthopedics;  Laterality: Right;    There were no vitals filed for this visit.  Subjective Assessment - 08/05/18 1318    Subjective  Having increased R knee "stiffness" today without known trigger.      Pertinent History  R partial knee replacement - 06/25/18    How long can you sit comfortably?  1 hour    How long can you stand comfortably?  30 min     How long can you walk comfortably?  25  min     Patient Stated Goals  "to improve my balance and make my leg stronger so I can do what I wan to do"    Currently in Pain?  Yes    Pain Score  3     Pain Location  Knee    Pain Orientation  Right    Pain Descriptors / Indicators  Aching   "stiffness"   Pain Type  Acute pain;Surgical pain    Multiple Pain Sites  No         [facility] PT Assessment - 08/05/18 0001      Assessment   Medical Diagnosis  R partial knee replacement    Referring Provider (PT)  Renette Butters, MD    Onset Date/Surgical Date  06/25/18    Hand Dominance  Right    Next MD Visit  08/09/18    Prior Therapy  [facility] PT until 07/08/18      Observation/Other Assessments   Focus on Therapeutic Outcomes (FOTO)   51% (49% limitation)      AROM   Right Knee Extension  0   0 dg AROM ext supported; ~ 2 dg quad lag with SLR  Right Knee Flexion  130      Strength   Strength Assessment Site  Hip;Knee    Right/Left Hip  Right;Left    Right Hip Flexion  4/5    Right Hip Extension  4/5    Right Hip ABduction  4+/5    Right Hip ADduction  4+/5    Left Hip Flexion  4/5    Left Hip Extension  4/5    Left Hip ABduction  4+/5    Left Hip ADduction  4+/5    Right/Left Knee  Right;Left    Right Knee Flexion  4+/5    Right Knee Extension  4+/5    Left Knee Flexion  4+/5    Left Knee Extension  4+/5                   [facility] Adult PT Treatment/Exercise - 08/05/18 0001      Ambulation/Gait   Ambulation/Gait  Yes    Ambulation/Gait Assistance  5: Supervision    Ambulation/Gait Assistance Details  cues provided for increased gait speed however pt. with good overall mechanics without AD    Ambulation Distance (Feet)  90 Feet    Stairs  Yes    Stairs Assistance  6: Modified independent (Device/Increase time)    Stair Management Technique  One rail Right;Alternating pattern    Number of Stairs  5    Height of Stairs  7    Gait Comments  Pt. able to navigate stair reciprocally however with visible need for  rail and limited R quad eccentric control and some B hip weakness noted       Knee/Hip Exercises: Aerobic   Recumbent Bike  L2 x 7 min      Knee/Hip Exercises: Supine   Bridges  15 reps;Both;Strengthening    Bridges with Diona Foley Squeeze  Both;15 reps;Strengthening    Straight Leg Raises  Right;15 reps    Straight Leg Raises Limitations  1#               PT Short Term Goals - 07/17/18 1437      PT SHORT TERM GOAL #1   Title  Independent with initial HEP    Status  Achieved        PT Long Term Goals - 08/05/18 1333      PT LONG TERM GOAL #1   Title  Independent with ongoing/advamced HEP    Status  Partially Met      PT LONG TERM GOAL #2   Title  Patient will improve R knee extension AROM to 0 dg with no quad lag    Status  Partially Met      PT LONG TERM GOAL #3   Title  R hip and knee strength >/= 4+/5 for improved stability    Status  Partially Met      PT LONG TERM GOAL #4   Title  Patient will ambulate with normal gait pattern and speed w/o AD to increase safety with community ambulation    Status  Partially Met      PT LONG TERM GOAL #5   Title  Patient will navigate up/down 5 stairs with good reciprocal pattern to increase eas of access to home    Status  Partially Met            Plan - 08/05/18 1334    Clinical Impression Statement  Special has made good progress with therapy.  Verbalized she feels her stability and strength  have improved since starting therapy however still having apprehension with steps to enter home with pain/stiffness primarily in mornings.  Able to partially meet LTG #2 able to demo supported R knee AROM extension to 0 dg in supine however still demonstrating ~ 2 dg quad lag with SLR.  R knee AROM flexion now 130 dg.  Reports daily adherence to HEP.  Able to partially achieve LTG #3 with MMT today demonstrating remaining strength deficits primarily in proximal hip musculature still grossly 4/5 strength.  Pt. able to demo much  improved gait pattern without AD now partially achieving LTG #4, however ambulating with decreased gait speed.  Progressing well toward LTG #5 navigating 5 steps reciprocally however with definite need for handrail and with visible lack of R quad eccentric control.  Pt. wishes to continue with therapy to increase functional stability and strength with stair navigation.  Will continue to benefit from further skilled therapy to maximize functional strength and mobility.  Progressing well toward all LTGs.      Rehab Potential  Excellent    PT Treatment/Interventions  Patient/family education;Therapeutic exercise;Therapeutic activities;Functional mobility training;Gait training;Stair training;Neuromuscular re-education;Balance training;Manual techniques;Passive range of motion;Dry needling;Taping;Electrical Stimulation;Cryotherapy;Vasopneumatic Device;Iontophoresis 71m/ml Dexamethasone;Moist Heat;ADLs/Self Care Home Management    PT Next Visit Plan  LE flexibility; R knee ROM; LE strengthening; Gait training weaning SPC as appropriate; Manual therapy and modalities as indicated    Consulted and Agree with Plan of Care  Patient       Patient will benefit from skilled therapeutic intervention in order to improve the following deficits and impairments:  Pain, Decreased strength, Impaired flexibility, Decreased range of motion, Increased muscle spasms, Decreased scar mobility, Decreased mobility, Decreased activity tolerance, Difficulty walking, Abnormal gait, Decreased balance, Decreased endurance  Visit Diagnosis: Acute pain of right knee  Stiffness of right knee, not elsewhere classified  Muscle weakness (generalized)  Other abnormalities of gait and mobility  Difficulty in walking, not elsewhere classified     Problem List Patient Active Problem List   Diagnosis Date Noted  . Primary localized osteoarthritis of knee 06/25/2018  . Anxiety state 06/14/2018  . Hyperlipidemia 06/14/2018  .  Primary osteoarthritis of right knee 06/14/2018    MBess Harvest PTA 08/05/18 4:13 PM   [facility] [address] [place] [address]Phone: [phone]  Fax:  [phone] Name: Alicia TorregrossaMRN: [id number] of Birth: 210-20-1947 Alicia Andersonprogressing well with PT although she tends to be very timid/self-limiting with new activities which slows her progress somewhat. All goals currently at least partially met. She will continue to benefit from skilled PT to restore functional R knee ROM and B LE strength to allow for return to PLOF with safe mobility and community access.  JPercival Spanish PT, MPT 08/06/18, 12:20 PM  [facility] [place] [address]Phone: [phone]  Fax:  [phone]

## 2018-08-08 ENCOUNTER — Encounter: Payer: Self-pay | Admitting: Physical Therapy

## 2018-08-08 ENCOUNTER — Ambulatory Visit: Payer: Medicare Other | Admitting: Physical Therapy

## 2018-08-08 DIAGNOSIS — R262 Difficulty in walking, not elsewhere classified: Secondary | ICD-10-CM

## 2018-08-08 DIAGNOSIS — M25561 Pain in right knee: Secondary | ICD-10-CM

## 2018-08-08 DIAGNOSIS — M6281 Muscle weakness (generalized): Secondary | ICD-10-CM

## 2018-08-08 DIAGNOSIS — R2689 Other abnormalities of gait and mobility: Secondary | ICD-10-CM

## 2018-08-08 DIAGNOSIS — M25661 Stiffness of right knee, not elsewhere classified: Secondary | ICD-10-CM

## 2018-08-08 NOTE — Therapy (Signed)
[facility] [place] [address]  [place], [place], [address] Phone: [phone]   Fax:  [phone]  Physical Therapy Treatment  Patient Details  Name: Alicia Reyes MRN: [id number] Date of Birth: 25-Aug-1945 Referring Provider (PT): Renette Butters, MD   Encounter Date: 08/08/2018  PT End of Session - 08/08/18 1101    Visit Number  11    Number of Visits  18    Date for PT Re-Evaluation  08/22/18    Authorization Type  Medicare & Aetna    PT Start Time  1101    PT Stop Time  1205    PT Time Calculation (min)  64 min    Activity Tolerance  Patient tolerated treatment well    Behavior During Therapy  [facility] for tasks assessed/performed       Past Medical History:  Diagnosis Date  . Anxiety   . Arthritis   . GERD (gastroesophageal reflux disease)   . Headache    cluster headaches  . Rosacea     Past Surgical History:  Procedure Laterality Date  . ABDOMINAL HYSTERECTOMY    . CARPAL TUNNEL RELEASE     left hand  . CATARACT EXTRACTION, BILATERAL     with lens implant  . CHOLECYSTECTOMY    . DILATION AND CURETTAGE OF UTERUS    . EYE SURGERY     retina surgery -bilateral  . FOOT SURGERY     bilateral feet  . PARTIAL KNEE ARTHROPLASTY Right 06/25/2018   Procedure: UNICOMPARTMENTAL KNEE;  Surgeon: Renette Butters, MD;  Location: [facility];  Service: Orthopedics;  Laterality: Right;    There were no vitals filed for this visit.  Subjective Assessment - 08/08/18 1106    Subjective  Pt noting some ongoing increased sensitivity to touch today, but denies increased pain.    Pertinent History  R partial knee replacement - 06/25/18    Patient Stated Goals  "to improve my balance and make my leg stronger so I can do what I wan to do"    Currently in Pain?  No/denies                       [facility] Adult PT Treatment/Exercise - 08/08/18 1101      Neuro Re-ed    Neuro Re-ed Details   Alt toe clears from  Airex pad to 9" step with 2# on ankles x 20; B side-stepping with cone knock-down & righting x 7 cones      Exercises   Exercises  Knee/Hip      Knee/Hip Exercises: Aerobic   Nustep  L5 x 6 min (LE only)      Knee/Hip Exercises: Standing   Hip Flexion  Right;Left;10 reps;Knee straight;Stengthening    Hip Flexion Limitations  red TB at ankle, intermittent 1 pole A    Hip ADduction  Right;Left;10 reps;Strengthening    Hip ADduction Limitations  red TB at ankle, intermittent 1 pole A    Hip Abduction  Right;Left;10 reps;Knee straight;Stengthening    Abduction Limitations  red TB at ankle, intermittent 1 pole A    Hip Extension  Right;Left;10 reps;Knee straight;Stengthening    Extension Limitations  red TB at ankle, intermittent 1 pole A    SLS with Vectors  R SLS on blue foam oval with L toe tap to 3 blanace pebbles x10, then 3 cones x 10; minimal intermittent 1 pole support    Other Standing Knee  Exercises  B side stepping & fed/back monster walk with looped yellow TB at ankles 2 x 25 ft    Other Standing Knee Exercises  B Fitter hip abduction (2 blue bands) x10      Modalities   Modalities  Vasopneumatic      Vasopneumatic   Number Minutes Vasopneumatic   15 minutes    Vasopnuematic Location   Knee    Vasopneumatic Pressure  Low    Vasopneumatic Temperature   lowest      Manual Therapy   Manual Therapy  Soft tissue mobilization    Soft tissue mobilization  R knee incisional scar massage               PT Short Term Goals - 07/17/18 1437      PT SHORT TERM GOAL #1   Title  Independent with initial HEP    Status  Achieved        PT Long Term Goals - 08/05/18 1333      PT LONG TERM GOAL #1   Title  Independent with ongoing/advamced HEP    Status  Partially Met      PT LONG TERM GOAL #2   Title  Patient will improve R knee extension AROM to 0 dg with no quad lag    Status  Partially Met      PT LONG TERM GOAL #3   Title  R hip and knee strength >/= 4+/5 for  improved stability    Status  Partially Met      PT LONG TERM GOAL #4   Title  Patient will ambulate with normal gait pattern and speed w/o AD to increase safety with community ambulation    Status  Partially Met      PT LONG TERM GOAL #5   Title  Patient will navigate up/down 5 stairs with good reciprocal pattern to increase eas of access to home    Status  Partially Met            Plan - 08/08/18 1108    Clinical Impression Statement  Treatment focusing on continued proximal strengthening adding increasing dynamic stability component to faciliate improved balance - close supervision/guarding during balance activities but only 2 minor LOB which were mostly self-corrected. Pt reporting final steri-strips have fallen off as of yesterday with no scabbing remaining, therefore encouraged pt to continue incisional scar massage along full length of incision.    Rehab Potential  Excellent    PT Treatment/Interventions  Patient/family education;Therapeutic exercise;Therapeutic activities;Functional mobility training;Gait training;Stair training;Neuromuscular re-education;Balance training;Manual techniques;Passive range of motion;Dry needling;Taping;Electrical Stimulation;Cryotherapy;Vasopneumatic Device;Iontophoresis 72m/ml Dexamethasone;Moist Heat;ADLs/Self Care Home Management    PT Next Visit Plan  LE flexibility; R knee ROM; LE strengthening; Gait training weaning SPC as appropriate; Manual therapy and modalities as indicated    Consulted and Agree with Plan of Care  Patient       Patient will benefit from skilled therapeutic intervention in order to improve the following deficits and impairments:  Pain, Decreased strength, Impaired flexibility, Decreased range of motion, Increased muscle spasms, Decreased scar mobility, Decreased mobility, Decreased activity tolerance, Difficulty walking, Abnormal gait, Decreased balance, Decreased endurance  Visit Diagnosis: Acute pain of right  knee  Stiffness of right knee, not elsewhere classified  Muscle weakness (generalized)  Other abnormalities of gait and mobility  Difficulty in walking, not elsewhere classified     Problem List Patient Active Problem List   Diagnosis Date Noted  . Primary localized osteoarthritis of knee  06/25/2018  . Anxiety state 06/14/2018  . Hyperlipidemia 06/14/2018  . Primary osteoarthritis of right knee 06/14/2018    Percival Spanish, PT, MPT 08/08/2018, 12:09 PM  [facility] [address]  [place], [place], [address] Phone: [phone]   Fax:  [phone]  Name: Alicia Reyes MRN: [id number] Date of Birth: [birth year]

## 2018-08-12 ENCOUNTER — Ambulatory Visit: Payer: Medicare Other | Attending: Orthopedic Surgery

## 2018-08-12 DIAGNOSIS — M6281 Muscle weakness (generalized): Secondary | ICD-10-CM | POA: Diagnosis present

## 2018-08-12 DIAGNOSIS — M25561 Pain in right knee: Secondary | ICD-10-CM | POA: Diagnosis present

## 2018-08-12 DIAGNOSIS — R262 Difficulty in walking, not elsewhere classified: Secondary | ICD-10-CM | POA: Diagnosis present

## 2018-08-12 DIAGNOSIS — M25661 Stiffness of right knee, not elsewhere classified: Secondary | ICD-10-CM | POA: Diagnosis present

## 2018-08-12 DIAGNOSIS — R2689 Other abnormalities of gait and mobility: Secondary | ICD-10-CM | POA: Insufficient documentation

## 2018-08-12 NOTE — Therapy (Signed)
[facility] [place] [address]  [place], [place], [address] Phone: [phone]   Fax:  [phone]  Physical Therapy Treatment  Patient Details  Name: Alicia Reyes MRN: [id number] Date of Birth: [birth year] Referring Provider (PT): Renette Butters, MD   Encounter Date: 08/12/2018  PT End of Session - 08/12/18 1314    Visit Number  12    Number of Visits  18    Date for PT Re-Evaluation  08/22/18    Authorization Type  Medicare & Aetna    PT Start Time  1311    PT Stop Time  1409    PT Time Calculation (min)  58 min    Activity Tolerance  Patient tolerated treatment well    Behavior During Therapy  [facility] for tasks assessed/performed       Past Medical History:  Diagnosis Date  . Anxiety   . Arthritis   . GERD (gastroesophageal reflux disease)   . Headache    cluster headaches  . Rosacea     Past Surgical History:  Procedure Laterality Date  . ABDOMINAL HYSTERECTOMY    . CARPAL TUNNEL RELEASE     left hand  . CATARACT EXTRACTION, BILATERAL     with lens implant  . CHOLECYSTECTOMY    . DILATION AND CURETTAGE OF UTERUS    . EYE SURGERY     retina surgery -bilateral  . FOOT SURGERY     bilateral feet  . PARTIAL KNEE ARTHROPLASTY Right 06/25/2018   Procedure: UNICOMPARTMENTAL KNEE;  Surgeon: Renette Butters, MD;  Location: [facility];  Service: Orthopedics;  Laterality: Right;    There were no vitals filed for this visit.  Subjective Assessment - 08/12/18 1324    Subjective  Pt. noting MD pleased with pt. progress and can transition to home program when she feels she is ready.      Pertinent History  R partial knee replacement - 06/25/18    Patient Stated Goals  "to improve my balance and make my leg stronger so I can do what I wan to do"    Currently in Pain?  Yes    Pain Score  1     Pain Location  Knee    Pain Orientation  Right    Pain Descriptors / Indicators  --   "stiffness"   Pain Type  Acute  pain;Surgical pain    Pain Onset  1 to 4 weeks ago    Pain Frequency  Intermittent    Multiple Pain Sites  No                       [facility] Adult PT Treatment/Exercise - 08/12/18 1327      Neuro Re-ed    Neuro Re-ed Details   Alternating bolster step over and reach to cone cross body with UE away from BOS x 10 each way with therapist overview    pt. very apprehensive on R step/back over bolster      Knee/Hip Exercises: Aerobic   Recumbent Bike  L2 x 7 min      Knee/Hip Exercises: Machines for Strengthening   Cybex Knee Extension  B LE: 10# x 15 reps     Cybex Knee Flexion  B LE: 20# x 15 reps      Knee/Hip Exercises: Standing   Heel Raises  Right;Both;20 reps    Forward Step Up  Right   x 12 reps  Wall Squat  15 reps;3 seconds      Vasopneumatic   Number Minutes Vasopneumatic   10 minutes    Vasopnuematic Location   Knee    Vasopneumatic Pressure  Low    Vasopneumatic Temperature   lowest      Manual Therapy   Manual Therapy  Soft tissue mobilization    Soft tissue mobilization  R medial/lateral HS; proximal calf                PT Short Term Goals - 07/17/18 1437      PT SHORT TERM GOAL #1   Title  Independent with initial HEP    Status  Achieved        PT Long Term Goals - 08/05/18 1333      PT LONG TERM GOAL #1   Title  Independent with ongoing/advamced HEP    Status  Partially Met      PT LONG TERM GOAL #2   Title  Patient will improve R knee extension AROM to 0 dg with no quad lag    Status  Partially Met      PT LONG TERM GOAL #3   Title  R hip and knee strength >/= 4+/5 for improved stability    Status  Partially Met      PT LONG TERM GOAL #4   Title  Patient will ambulate with normal gait pattern and speed w/o AD to increase safety with community ambulation    Status  Partially Met      PT LONG TERM GOAL #5   Title  Patient will navigate up/down 5 stairs with good reciprocal pattern to increase eas of access to home     Status  Partially Met            Plan - 08/12/18 1315    Clinical Impression Statement  Pt. doing well reporting MD released her from therapy at recent f/u however she wishes to continue coming to focus on strength and balance.  Session focusing on SLS, dynamic step-and-reach activities and progression of 8" step-ups.  Pt. requiring supervision/CGA at times and frequent encouragement from therapist for effort with activities.  Ended visit with ice/compression to R knee for reduction in post-exercise swelling.        Rehab Potential  Excellent    PT Treatment/Interventions  Patient/family education;Therapeutic exercise;Therapeutic activities;Functional mobility training;Gait training;Stair training;Neuromuscular re-education;Balance training;Manual techniques;Passive range of motion;Dry needling;Taping;Electrical Stimulation;Cryotherapy;Vasopneumatic Device;Iontophoresis 4m/ml Dexamethasone;Moist Heat;ADLs/Self Care Home Management    PT Next Visit Plan  LE flexibility; R knee ROM; LE strengthening; Gait training weaning SPC as appropriate; Manual therapy and modalities as indicated    Consulted and Agree with Plan of Care  Patient       Patient will benefit from skilled therapeutic intervention in order to improve the following deficits and impairments:  Pain, Decreased strength, Impaired flexibility, Decreased range of motion, Increased muscle spasms, Decreased scar mobility, Decreased mobility, Decreased activity tolerance, Difficulty walking, Abnormal gait, Decreased balance, Decreased endurance  Visit Diagnosis: Acute pain of right knee  Stiffness of right knee, not elsewhere classified  Muscle weakness (generalized)  Other abnormalities of gait and mobility  Difficulty in walking, not elsewhere classified     Problem List Patient Active Problem List   Diagnosis Date Noted  . Primary localized osteoarthritis of knee 06/25/2018  . Anxiety state 06/14/2018  . Hyperlipidemia  06/14/2018  . Primary osteoarthritis of right knee 06/14/2018    MBess Harvest PTA 08/12/18 6:17 PM  [facility] [address]  [place], [place], [address] Phone: [phone]   Fax:  [phone]  Name: Alicia Reyes MRN: [id number] Date of Birth: 28-Jun-1945

## 2018-08-15 ENCOUNTER — Encounter: Payer: Self-pay | Admitting: Physical Therapy

## 2018-08-15 ENCOUNTER — Ambulatory Visit: Payer: Medicare Other | Admitting: Physical Therapy

## 2018-08-15 DIAGNOSIS — R262 Difficulty in walking, not elsewhere classified: Secondary | ICD-10-CM

## 2018-08-15 DIAGNOSIS — M25661 Stiffness of right knee, not elsewhere classified: Secondary | ICD-10-CM

## 2018-08-15 DIAGNOSIS — M25561 Pain in right knee: Secondary | ICD-10-CM

## 2018-08-15 DIAGNOSIS — M6281 Muscle weakness (generalized): Secondary | ICD-10-CM

## 2018-08-15 DIAGNOSIS — R2689 Other abnormalities of gait and mobility: Secondary | ICD-10-CM

## 2018-08-15 NOTE — Therapy (Signed)
[facility] [place] [address]  [place], [place], [address] Phone: [phone]   Fax:  [phone]  Physical Therapy Evaluation  Patient Details  Name: Alicia Reyes MRN: [id number] Date of Birth: 04/05/1946 Referring Provider (PT): Renette Butters, MD   Encounter Date: 08/15/2018  PT End of Session - 08/15/18 1309    Visit Number  13    Number of Visits  18    Date for PT Re-Evaluation  08/22/18    Authorization Type  Medicare & Aetna    PT Start Time  1309    PT Stop Time  1407    PT Time Calculation (min)  58 min    Activity Tolerance  Patient tolerated treatment well    Behavior During Therapy  [facility] for tasks assessed/performed       Past Medical History:  Diagnosis Date  . Anxiety   . Arthritis   . GERD (gastroesophageal reflux disease)   . Headache    cluster headaches  . Rosacea     Past Surgical History:  Procedure Laterality Date  . ABDOMINAL HYSTERECTOMY    . CARPAL TUNNEL RELEASE     left hand  . CATARACT EXTRACTION, BILATERAL     with lens implant  . CHOLECYSTECTOMY    . DILATION AND CURETTAGE OF UTERUS    . EYE SURGERY     retina surgery -bilateral  . FOOT SURGERY     bilateral feet  . PARTIAL KNEE ARTHROPLASTY Right 06/25/2018   Procedure: UNICOMPARTMENTAL KNEE;  Surgeon: Renette Butters, MD;  Location: [facility];  Service: Orthopedics;  Laterality: Right;    There were no vitals filed for this visit.   Subjective Assessment - 08/15/18 1312    Subjective  Pt reporting she is wishing to continue PT to focus on comfort on stairs.    Pertinent History  R partial knee replacement - 06/25/18    Patient Stated Goals  "to improve my balance and make my leg stronger so I can do what I wan to do"    Currently in Pain?  Yes    Pain Score  1     Pain Onset  1 to 4 weeks ago                    Objective measurements completed on examination: See above findings.      [facility]  Adult PT Treatment/Exercise - 08/15/18 1309      Ambulation/Gait   Ambulation/Gait Assistance  7: Independent    Ambulation/Gait Assistance Details  cues for increased hip/knee flexion with heel stike on weight acceptance    Stairs Assistance  6: Modified independent (Device/Increase time)    Stair Management Technique  One rail Right;Alternating pattern    Number of Stairs  14   +5   Height of Stairs  7      Exercises   Exercises  Knee/Hip      Knee/Hip Exercises: Standing   Forward Step Up  Right;10 reps;2 sets;Hand Hold: 0;Step Height: 4";Step Height: 6"   1 set at each height   Forward Step Up Limitations  emphasizing TKE upon lift before setting L foot down    Step Down  Right;10 reps;2 sets;Step Height: 4";Hand Hold: 1    Step Down Limitations  lateral & forward eccentric lowering with light heel touch    Other Standing Knee Exercises  R forward step-overs 2 x 10, 1st set over 4" step,  2nd set over 6" step      Modalities   Modalities  Vasopneumatic      Vasopneumatic   Number Minutes Vasopneumatic   10 minutes    Vasopnuematic Location   Knee    Vasopneumatic Pressure  Low    Vasopneumatic Temperature   lowest      Manual Therapy   Manual Therapy  Soft tissue mobilization    Soft tissue mobilization  R knee incisional scar massage - slight restriction at superior/lateral patella & distal incision               PT Short Term Goals - 07/17/18 1437      PT SHORT TERM GOAL #1   Title  Independent with initial HEP    Status  Achieved        PT Long Term Goals - 08/05/18 1333      PT LONG TERM GOAL #1   Title  Independent with ongoing/advamced HEP    Status  Partially Met      PT LONG TERM GOAL #2   Title  Patient will improve R knee extension AROM to 0 dg with no quad lag    Status  Partially Met      PT LONG TERM GOAL #3   Title  R hip and knee strength >/= 4+/5 for improved stability    Status  Partially Met      PT LONG TERM GOAL #4   Title   Patient will ambulate with normal gait pattern and speed w/o AD to increase safety with community ambulation    Status  Partially Met      PT LONG TERM GOAL #5   Title  Patient will navigate up/down 5 stairs with good reciprocal pattern to increase eas of access to home    Status  Partially Met             Plan - 08/15/18 1314    Clinical Impression Statement  Krysten reporting greatest concern at present is lack of comfort with negotiating stairs, therefore majority of session focused on strengthening and stair mechanics to improve stability on stairs. Also reviewed normal gait pattern with emphasis on increased hip and knee flexion and heel strike for improved foot clearnace as pt noting she stumbled at home yesterday when she caught her foot. Pt nearing end of current POC, therefore will plan for HEP review/update next visit.    Rehab Potential  Excellent    PT Treatment/Interventions  Patient/family education;Therapeutic exercise;Therapeutic activities;Functional mobility training;Gait training;Stair training;Neuromuscular re-education;Balance training;Manual techniques;Passive range of motion;Dry needling;Taping;Electrical Stimulation;Cryotherapy;Vasopneumatic Device;Iontophoresis 27m/ml Dexamethasone;Moist Heat;ADLs/Self Care Home Management    PT Next Visit Plan  HEP review/update; Strengthening with focusing on stair negotiation; Manual therapy and modalities as indicated    Consulted and Agree with Plan of Care  Patient       Patient will benefit from skilled therapeutic intervention in order to improve the following deficits and impairments:  Pain, Decreased strength, Impaired flexibility, Decreased range of motion, Increased muscle spasms, Decreased scar mobility, Decreased mobility, Decreased activity tolerance, Difficulty walking, Abnormal gait, Decreased balance, Decreased endurance  Visit Diagnosis: Acute pain of right knee  Stiffness of right knee, not elsewhere  classified  Muscle weakness (generalized)  Other abnormalities of gait and mobility  Difficulty in walking, not elsewhere classified     Problem List Patient Active Problem List   Diagnosis Date Noted  . Primary localized osteoarthritis of knee 06/25/2018  . Anxiety state 06/14/2018  .  Hyperlipidemia 06/14/2018  . Primary osteoarthritis of right knee 06/14/2018    JoAnne M Kreis, PT, MPT 08/15/2018, 3:33 PM  [facility] [place] [address]  [address] [place], [place], [address] Phone: [phone]   Fax:  [phone]  Name: Keila Barbour MRN: [id number] Date of Birth: 12/23/1945  

## 2018-08-19 ENCOUNTER — Ambulatory Visit: Payer: Medicare Other

## 2018-08-19 DIAGNOSIS — R262 Difficulty in walking, not elsewhere classified: Secondary | ICD-10-CM

## 2018-08-19 DIAGNOSIS — M25561 Pain in right knee: Secondary | ICD-10-CM | POA: Diagnosis not present

## 2018-08-19 DIAGNOSIS — M25661 Stiffness of right knee, not elsewhere classified: Secondary | ICD-10-CM

## 2018-08-19 DIAGNOSIS — R2689 Other abnormalities of gait and mobility: Secondary | ICD-10-CM

## 2018-08-19 DIAGNOSIS — M6281 Muscle weakness (generalized): Secondary | ICD-10-CM

## 2018-08-19 NOTE — Therapy (Signed)
[facility] [address]  [place], [place], [address] Phone: [phone]   Fax:  [phone]  Physical Therapy Treatment  Patient Details  Name: Alicia Reyes MRN: [id number] Date of Birth: 15-Jan-1946 Referring Provider (PT): Renette Butters, MD   Encounter Date: 08/19/2018  PT End of Session - 08/19/18 1320    Visit Number  14    Number of Visits  18    Date for PT Re-Evaluation  08/22/18    Authorization Type  Medicare & Aetna    PT Start Time  1309    PT Stop Time  1400    PT Time Calculation (min)  51 min    Activity Tolerance  Patient tolerated treatment well    Behavior During Therapy  [facility] for tasks assessed/performed       Past Medical History:  Diagnosis Date  . Anxiety   . Arthritis   . GERD (gastroesophageal reflux disease)   . Headache    cluster headaches  . Rosacea     Past Surgical History:  Procedure Laterality Date  . ABDOMINAL HYSTERECTOMY    . CARPAL TUNNEL RELEASE     left hand  . CATARACT EXTRACTION, BILATERAL     with lens implant  . CHOLECYSTECTOMY    . DILATION AND CURETTAGE OF UTERUS    . EYE SURGERY     retina surgery -bilateral  . FOOT SURGERY     bilateral feet  . PARTIAL KNEE ARTHROPLASTY Right 06/25/2018   Procedure: UNICOMPARTMENTAL KNEE;  Surgeon: Renette Butters, MD;  Location: [facility];  Service: Orthopedics;  Laterality: Right;    There were no vitals filed for this visit.  Subjective Assessment - 08/19/18 1319    Subjective  Reports she feels she may be comfortable transitioning to home program at next visit and her and husband have plans to attend local [employer].    Pertinent History  R partial knee replacement - 06/25/18    Patient Stated Goals  "to improve my balance and make my leg stronger so I can do what I wan to do"    Currently in Pain?  Yes    Pain Score  1     Pain Location  Knee    Pain Orientation  Right    Pain Type  Acute pain;Surgical pain     Multiple Pain Sites  No                       [facility] Adult PT Treatment/Exercise - 08/19/18 1324      Self-Care   Self-Care  Other Self-Care Comments    Other Self-Care Comments   Review of current HEP with addition of review of gym LE machine strengthening activities as pt. noting she plans to attend [employer] in future       Knee/Hip Exercises: Aerobic   Recumbent Bike  L2 x 7 min      Knee/Hip Exercises: Machines for Strengthening   Cybex Knee Extension  B LE: 15# x 15 reps     Cybex Knee Flexion  B LE: 20# x 20 reps    Cybex Leg Press  B LE's 20# x 20 reps       Knee/Hip Exercises: Standing   Forward Step Up  Right;15 reps;Hand Hold: 1    Forward Step Up Limitations  7" step in stairwell     Functional Squat  20 reps;3 seconds    Functional  Squat Limitations  counter - depth to pt. tolerance     Other Standing Knee Exercises  R retro step to 7" step in stairwell x 10 reps                PT Short Term Goals - 07/17/18 1437      PT SHORT TERM GOAL #1   Title  Independent with initial HEP    Status  Achieved        PT Long Term Goals - 08/05/18 1333      PT LONG TERM GOAL #1   Title  Independent with ongoing/advamced HEP    Status  Partially Met      PT LONG TERM GOAL #2   Title  Patient will improve R knee extension AROM to 0 dg with no quad lag    Status  Partially Met      PT LONG TERM GOAL #3   Title  R hip and knee strength >/= 4+/5 for improved stability    Status  Partially Met      PT LONG TERM GOAL #4   Title  Patient will ambulate with normal gait pattern and speed w/o AD to increase safety with community ambulation    Status  Partially Met      PT LONG TERM GOAL #5   Title  Patient will navigate up/down 5 stairs with good reciprocal pattern to increase eas of access to home    Status  Partially Met            Plan - 08/19/18 1320    Clinical Impression Statement  Pt. doing well today.  Verbalized that she feels  comfortable transitioning to home program after next session.  Did ask about review of gym LE strengthening equipment thus updated HEP with handout for these and demonstrated each machine activity in session today.  Pt. verbalized understanding of this.  Will plan for final goal testing and HEP review at upcoming session per pt.      Rehab Potential  Excellent    PT Treatment/Interventions  Patient/family education;Therapeutic exercise;Therapeutic activities;Functional mobility training;Gait training;Stair training;Neuromuscular re-education;Balance training;Manual techniques;Passive range of motion;Dry needling;Taping;Electrical Stimulation;Cryotherapy;Vasopneumatic Device;Iontophoresis 13m/ml Dexamethasone;Moist Heat;ADLs/Self Care Home Management    Consulted and Agree with Plan of Care  Patient       Patient will benefit from skilled therapeutic intervention in order to improve the following deficits and impairments:  Pain, Decreased strength, Impaired flexibility, Decreased range of motion, Increased muscle spasms, Decreased scar mobility, Decreased mobility, Decreased activity tolerance, Difficulty walking, Abnormal gait, Decreased balance, Decreased endurance  Visit Diagnosis: Acute pain of right knee  Stiffness of right knee, not elsewhere classified  Muscle weakness (generalized)  Other abnormalities of gait and mobility  Difficulty in walking, not elsewhere classified     Problem List Patient Active Problem List   Diagnosis Date Noted  . Primary localized osteoarthritis of knee 06/25/2018  . Anxiety state 06/14/2018  . Hyperlipidemia 06/14/2018  . Primary osteoarthritis of right knee 06/14/2018    MBess Harvest PTA 08/19/18 6:24 PM   [facility] [address] [place] [address]Phone: [phone]  Fax:  [phone] Name: Alicia DigginsMRN: [id number] of Birth: 2November 05, 1947

## 2018-08-22 ENCOUNTER — Ambulatory Visit: Payer: Medicare Other | Admitting: Physical Therapy

## 2018-08-22 ENCOUNTER — Encounter: Payer: Self-pay | Admitting: Physical Therapy

## 2018-08-22 ENCOUNTER — Other Ambulatory Visit: Payer: Self-pay

## 2018-08-22 DIAGNOSIS — M25661 Stiffness of right knee, not elsewhere classified: Secondary | ICD-10-CM

## 2018-08-22 DIAGNOSIS — R2689 Other abnormalities of gait and mobility: Secondary | ICD-10-CM

## 2018-08-22 DIAGNOSIS — M25561 Pain in right knee: Secondary | ICD-10-CM | POA: Diagnosis not present

## 2018-08-22 DIAGNOSIS — R262 Difficulty in walking, not elsewhere classified: Secondary | ICD-10-CM

## 2018-08-22 DIAGNOSIS — M6281 Muscle weakness (generalized): Secondary | ICD-10-CM

## 2018-08-22 NOTE — Therapy (Addendum)
[facility] [address]  [place], [place], [address] Phone: [phone]   Fax:  [phone]  Physical Therapy Treatment / Discharge Summary  Patient Details  Name: Anaissa Macfadden MRN: [id number] Date of Birth: 1945/08/11 Referring Provider (PT): Renette Butters, MD   Encounter Date: 08/22/2018  PT End of Session - 08/22/18 1310    Visit Number  15    Number of Visits  18    Date for PT Re-Evaluation  08/22/18    Authorization Type  Medicare & Aetna    PT Start Time  1310    PT Stop Time  1358    PT Time Calculation (min)  48 min    Activity Tolerance  Patient tolerated treatment well    Behavior During Therapy  [facility] for tasks assessed/performed       Past Medical History:  Diagnosis Date  . Anxiety   . Arthritis   . GERD (gastroesophageal reflux disease)   . Headache    cluster headaches  . Rosacea     Past Surgical History:  Procedure Laterality Date  . ABDOMINAL HYSTERECTOMY    . CARPAL TUNNEL RELEASE     left hand  . CATARACT EXTRACTION, BILATERAL     with lens implant  . CHOLECYSTECTOMY    . DILATION AND CURETTAGE OF UTERUS    . EYE SURGERY     retina surgery -bilateral  . FOOT SURGERY     bilateral feet  . PARTIAL KNEE ARTHROPLASTY Right 06/25/2018   Procedure: UNICOMPARTMENTAL KNEE;  Surgeon: Renette Butters, MD;  Location: [facility];  Service: Orthopedics;  Laterality: Right;    There were no vitals filed for this visit.      [facility] PT Assessment - 08/22/18 1310      Assessment   Medical Diagnosis  R partial knee replacement    Referring Provider (PT)  Renette Butters, MD    Onset Date/Surgical Date  06/25/18    Next MD Visit  PRN      AROM   Right Knee Extension  0   no quad lag with SLR   Right Knee Flexion  131      Strength   Right Hip Flexion  4+/5    Right Hip Extension  4+/5    Right Hip ABduction  4+/5    Right Hip ADduction  4+/5    Left Hip Flexion  4+/5    Left Hip Extension  4+/5    Left Hip ABduction  4+/5    Left Hip ADduction  4+/5    Right Knee Flexion  5/5    Right Knee Extension  5/5    Left Knee Flexion  4+/5    Left Knee Extension  4+/5                   [facility] Adult PT Treatment/Exercise - 08/22/18 1310      Ambulation/Gait   Ambulation/Gait Assistance  7: Independent    Stairs Assistance  6: Modified independent (Device/Increase time)    Stair Management Technique  One rail Right;Alternating pattern    Number of Stairs  5   x 2   Height of Stairs  7      Self-Care   Self-Care  Scar Mobilizations;Other Self-Care Comments    Scar Mobilizations  Reviewed scar mobilization technique and reassured pt that she was performing this correctly.      Exercises   Exercises  Knee/Hip      Knee/Hip Exercises: Stretches   Passive Hamstring Stretch  Right;30 seconds;1 rep    Passive Hamstring Stretch Limitations  seated hip hinge      Knee/Hip Exercises: Aerobic   Nustep  L6 x 6 min (LE only)      Manual Therapy   Other Manual Therapy  Reviewed technique for home self-performance of R patellar mobs & and self-STM using rolling pin for areas of muscles tightness/tautu bands.               PT Short Term Goals - 07/17/18 1437      PT SHORT TERM GOAL #1   Title  Independent with initial HEP    Status  Achieved        PT Long Term Goals - 08/22/18 1312      PT LONG TERM GOAL #1   Title  Independent with ongoing/advamced HEP    Status  Achieved      PT LONG TERM GOAL #2   Title  Patient will improve R knee extension AROM to 0 dg with no quad lag    Status  Achieved      PT LONG TERM GOAL #3   Title  R hip and knee strength >/= 4+/5 for improved stability    Status  Achieved      PT LONG TERM GOAL #4   Title  Patient will ambulate with normal gait pattern and speed w/o AD to increase safety with community ambulation    Status  Achieved      PT LONG TERM GOAL #5   Title  Patient will navigate  up/down 5 stairs with good reciprocal pattern to increase eas of access to home    Status  Achieved            Plan - 08/22/18 1313    Clinical Impression Statement  Jaidence has demonstrated excellent progress with PT s/p R partial knee replacement. R knee AROM 0-131 dg with no quad lag on SLR and overall hip and knee strength 4+/5. Pt able to ambulate w/o AD and negotiate 5 stairs reciprocally with normal gait/step pattern. She feels confident with ongoing HEP, but did require confirmation of correct technique for patellar and scar tissue mobilization. All goals met for this episode, however extensive reassurance necessary that pt ready to transition to HEP and that things will continue to improve over time, therefore pt placed on hold for 30 days.    Rehab Potential  Excellent    PT Treatment/Interventions  Patient/family education;Therapeutic exercise;Therapeutic activities;Functional mobility training;Gait training;Stair training;Neuromuscular re-education;Balance training;Manual techniques;Passive range of motion;Dry needling;Taping;Electrical Stimulation;Cryotherapy;Vasopneumatic Device;Iontophoresis 82m/ml Dexamethasone;Moist Heat;ADLs/Self Care Home Management    PT Next Visit Plan  30 day hold    Consulted and Agree with Plan of Care  Patient       Patient will benefit from skilled therapeutic intervention in order to improve the following deficits and impairments:  Pain, Decreased strength, Impaired flexibility, Decreased range of motion, Increased muscle spasms, Decreased scar mobility, Decreased mobility, Decreased activity tolerance, Difficulty walking, Abnormal gait, Decreased balance, Decreased endurance  Visit Diagnosis: Acute pain of right knee  Stiffness of right knee, not elsewhere classified  Muscle weakness (generalized)  Other abnormalities of gait and mobility  Difficulty in walking, not elsewhere classified     Problem List Patient Active Problem List    Diagnosis Date Noted  . Primary localized osteoarthritis of knee 06/25/2018  . Anxiety state 06/14/2018  . Hyperlipidemia 06/14/2018  .  Primary osteoarthritis of right knee 06/14/2018    Percival Spanish, PT, MPT 08/22/2018, 2:15 PM  [facility] [address]  [place], [place], [address] Phone: [phone]   Fax:  [phone]  Name: Bianney Rockwood MRN: [id number] Date of Birth: 11-Jun-1946  PHYSICAL THERAPY DISCHARGE SUMMARY  Visits from Start of Care: 15  Current functional level related to goals / functional outcomes:   Refer to above clinical impression for status as of last visit on 08/22/2018. Patient was placed on hold for 30 days and has not needed to return to PT, therefore will proceed with discharge from PT for this episode.   Remaining deficits:   As above.   Education / Equipment:   HEP  Plan: Patient agrees to discharge.  Patient goals were met. Patient is being discharged due to meeting the stated rehab goals.  ?????     Percival Spanish, PT, MPT 10/03/18, 10:29 AM  [facility] [address]  [place], [place], [address] Phone: [phone]   Fax:  [phone]

## 2018-08-28 ENCOUNTER — Other Ambulatory Visit (HOSPITAL_COMMUNITY): Payer: Self-pay | Admitting: Orthopedic Surgery

## 2018-08-28 ENCOUNTER — Ambulatory Visit (HOSPITAL_COMMUNITY)
Admission: RE | Admit: 2018-08-28 | Discharge: 2018-08-28 | Disposition: A | Discharge status: Home / Self Care | Payer: Medicare Other | Source: Ambulatory Visit | Attending: Internal Medicine | Admitting: Internal Medicine

## 2018-08-28 DIAGNOSIS — M79604 Pain in right leg: Secondary | ICD-10-CM | POA: Insufficient documentation

## 2019-05-23 ENCOUNTER — Emergency Department (HOSPITAL_BASED_OUTPATIENT_CLINIC_OR_DEPARTMENT_OTHER): Payer: Medicare Other

## 2019-05-23 ENCOUNTER — Encounter (HOSPITAL_BASED_OUTPATIENT_CLINIC_OR_DEPARTMENT_OTHER): Payer: Self-pay | Admitting: Emergency Medicine

## 2019-05-23 ENCOUNTER — Emergency Department (HOSPITAL_BASED_OUTPATIENT_CLINIC_OR_DEPARTMENT_OTHER)
Admission: EM | Admit: 2019-05-23 | Discharge: 2019-05-23 | Disposition: A | Discharge status: Home / Self Care | Payer: Medicare Other | Attending: Emergency Medicine | Admitting: Emergency Medicine

## 2019-05-23 ENCOUNTER — Other Ambulatory Visit: Payer: Self-pay

## 2019-05-23 DIAGNOSIS — R1012 Left upper quadrant pain: Secondary | ICD-10-CM | POA: Insufficient documentation

## 2019-05-23 DIAGNOSIS — Z79899 Other long term (current) drug therapy: Secondary | ICD-10-CM | POA: Insufficient documentation

## 2019-05-23 DIAGNOSIS — Z91040 Latex allergy status: Secondary | ICD-10-CM | POA: Diagnosis not present

## 2019-05-23 DIAGNOSIS — Z96651 Presence of right artificial knee joint: Secondary | ICD-10-CM | POA: Diagnosis not present

## 2019-05-23 LAB — CBC WITH DIFFERENTIAL/PLATELET
Abs Immature Granulocytes: 0.01 10*3/uL (ref 0.00–0.07)
Basophils Absolute: 0 10*3/uL (ref 0.0–0.1)
Basophils Relative: 1 %
Eosinophils Absolute: 0.2 10*3/uL (ref 0.0–0.5)
Eosinophils Relative: 6 %
HCT: 40.4 % (ref 36.0–46.0)
Hemoglobin: 13.1 g/dL (ref 12.0–15.0)
Immature Granulocytes: 0 %
Lymphocytes Relative: 41 %
Lymphs Abs: 1.7 10*3/uL (ref 0.7–4.0)
MCH: 29.6 pg (ref 26.0–34.0)
MCHC: 32.4 g/dL (ref 30.0–36.0)
MCV: 91.2 fL (ref 80.0–100.0)
Monocytes Absolute: 0.4 10*3/uL (ref 0.1–1.0)
Monocytes Relative: 10 %
Neutro Abs: 1.7 10*3/uL (ref 1.7–7.7)
Neutrophils Relative %: 42 %
Platelets: 231 10*3/uL (ref 150–400)
RBC: 4.43 MIL/uL (ref 3.87–5.11)
RDW: 13.1 % (ref 11.5–15.5)
WBC: 4.1 10*3/uL (ref 4.0–10.5)
nRBC: 0 % (ref 0.0–0.2)

## 2019-05-23 LAB — COMPREHENSIVE METABOLIC PANEL
ALT: 18 U/L (ref 0–44)
AST: 23 U/L (ref 15–41)
Albumin: 4 g/dL (ref 3.5–5.0)
Alkaline Phosphatase: 62 U/L (ref 38–126)
Anion gap: 6 (ref 5–15)
BUN: 12 mg/dL (ref 8–23)
CO2: 28 mmol/L (ref 22–32)
Calcium: 9.2 mg/dL (ref 8.9–10.3)
Chloride: 107 mmol/L (ref 98–111)
Creatinine, Ser: 0.61 mg/dL (ref 0.44–1.00)
GFR calc Af Amer: >60 mL/min (ref >60)
GFR calc non Af Amer: >60 mL/min (ref >60)
Glucose, Bld: 114 mg/dL — ABNORMAL HIGH (ref 70–99)
Potassium: 3.9 mmol/L (ref 3.5–5.1)
Sodium: 141 mmol/L (ref 135–145)
Total Bilirubin: 0.6 mg/dL (ref 0.3–1.2)
Total Protein: 6.7 g/dL (ref 6.5–8.1)

## 2019-05-23 LAB — URINALYSIS, ROUTINE W REFLEX MICROSCOPIC
Bilirubin Urine: NEGATIVE
Glucose, UA: NEGATIVE mg/dL
Hgb urine dipstick: NEGATIVE
Ketones, ur: NEGATIVE mg/dL
Leukocytes,Ua: NEGATIVE
Nitrite: NEGATIVE
Protein, ur: NEGATIVE mg/dL
Specific Gravity, Urine: 1.02 (ref 1.005–1.030)
pH: 6.5 (ref 5.0–8.0)

## 2019-05-23 LAB — LIPASE, BLOOD: Lipase: 25 U/L (ref 11–51)

## 2019-05-23 MED ORDER — ONDANSETRON 4 MG PO TBDP: 4.0000 mg | ORAL_TABLET | Freq: Three times a day (TID) | ORAL | 0 refills | Status: AC | PRN

## 2019-05-23 MED ORDER — IOHEXOL 300 MG/ML  SOLN
100.0000 mL | Freq: Once | INTRAMUSCULAR | Status: AC | PRN
  Administered 2019-05-23: 100 mL via INTRAVENOUS

## 2019-05-23 MED ORDER — SODIUM CHLORIDE 0.9 % IV BOLUS
1000.0000 mL | Freq: Once | INTRAVENOUS | Status: AC
  Administered 2019-05-23: 1000 mL via INTRAVENOUS

## 2019-05-23 MED ORDER — ONDANSETRON HCL 4 MG/2ML IJ SOLN
4.0000 mg | Freq: Once | INTRAMUSCULAR | Status: AC
  Administered 2019-05-23: 4 mg via INTRAVENOUS

## 2019-05-23 NOTE — ED Notes (Signed)
ED Provider at bedside. 

## 2019-05-23 NOTE — ED Triage Notes (Signed)
Patient arrived via POV c/o lower left side abdominal pain last night that was still painful this morning. Patient states this is the third instance of this happening, the first occurrence approximately 2 weeks prior. Patient is AO x 4, elevated bp, normal gait.

## 2019-05-23 NOTE — ED Provider Notes (Signed)
[facility] EMERGENCY DEPARTMENT Provider Note   CSN: [id number] Arrival date & time: 05/23/19  8250     History Chief Complaint  Patient presents with  . Abdominal Pain    Alicia Reyes is a 73 y.o. female.  Pt presents to the ED today with left sided abdominal pain.  Pt said she has had 3 episodes of severe left sided abdominal pain since the end of November.  The pt has noticed it when she bends over.  It usually goes away, but last night it continued on.  The pain is located in a spot where a surgeon had to place a laparoscopic instrument in her gallbladder removal last June.  She denies f/c.  No known covid exposures.  No cough.  Some nausea.        Past Medical History:  Diagnosis Date  . Anxiety   . Arthritis   . GERD (gastroesophageal reflux disease)   . Headache    cluster headaches  . Rosacea     Patient Active Problem List   Diagnosis Date Noted  . Primary localized osteoarthritis of knee 06/25/2018  . Anxiety state 06/14/2018  . Hyperlipidemia 06/14/2018  . Primary osteoarthritis of right knee 06/14/2018    Past Surgical History:  Procedure Laterality Date  . ABDOMINAL HYSTERECTOMY    . CARPAL TUNNEL RELEASE     left hand  . CATARACT EXTRACTION, BILATERAL     with lens implant  . CHOLECYSTECTOMY    . DILATION AND CURETTAGE OF UTERUS    . EYE SURGERY     retina surgery -bilateral  . FOOT SURGERY     bilateral feet  . PARTIAL KNEE ARTHROPLASTY Right 06/25/2018   Procedure: UNICOMPARTMENTAL KNEE;  Surgeon: Renette Butters, MD;  [facility]: [facility];  Service: Orthopedics;  Laterality: Right;     OB History   No obstetric history on file.     History reviewed. No pertinent family history.  Social History   Tobacco Use  . Smoking status: Never Smoker  . Smokeless tobacco: Never Used  Substance Use Topics  . Alcohol use: Never  . Drug use: Never    Home Medications Prior to Admission medications   Medication Sig Start Date  End Date Taking? Authorizing Provider  acyclovir (ZOVIRAX) 400 MG tablet Take 400 mg by mouth 3 (three) times daily as needed (outbreaks).    [provider]  aspirin EC 81 MG tablet Take 1 tablet (81 mg total) by mouth 2 (two) times daily. For DVT prophylaxis for 30 days after surgery. 06/25/18   Prudencio Burly III, PA-C  Calcium-Magnesium 500-250 MG TABS Take 2 capsules by mouth 2 (two) times a week.    [provider]  cetirizine (ZYRTEC) 10 MG tablet Take 10 mg by mouth daily as needed for allergies.     [provider]  docusate sodium (COLACE) 100 MG capsule Take 1 capsule (100 mg total) by mouth 2 (two) times daily. To prevent constipation while taking pain medication. 06/25/18   Martensen, Charna Elizabeth III, PA-C  gabapentin (NEURONTIN) 300 MG capsule Take 1 capsule (300 mg total) by mouth 2 (two) times daily for 14 days. For 2 weeks post op for pain. 06/25/18 07/09/18  Martensen, Charna Elizabeth III, PA-C  Glucosamine-Chondroit-Vit C-Mn (GLUCOSAMINE CHONDR 1500 COMPLX) CAPS Take 5 capsules by mouth 2 (two) times a week.    [provider]  LORazepam (ATIVAN) 1 MG tablet Take 1 mg by mouth at bedtime as needed  for anxiety or sleep.    [provider]  Lutein 10 MG TABS Take 10 mg by mouth 2 (two) times a week.    [provider]  methocarbamol (ROBAXIN) 500 MG tablet Take 1 tablet (500 mg total) by mouth every 8 (eight) hours as needed for muscle spasms. 06/25/18   Albina Billet III, PA-C  omeprazole (PRILOSEC) 20 MG capsule Take 1 capsule (20 mg total) by mouth daily. 30 days for gastroprotection while taking NSAIDs. 06/25/18   Martensen, Lucretia Kern III, PA-C  ondansetron (ZOFRAN ODT) 4 MG disintegrating tablet Take 1 tablet (4 mg total) by mouth every 8 (eight) hours as needed. 05/23/19   Jacalyn Lefevre, MD  ondansetron (ZOFRAN) 4 MG tablet Take 1 tablet (4 mg total) by mouth every 8 (eight) hours as needed for nausea or  vomiting. 06/25/18   Martensen, Lucretia Kern III, PA-C  OVER THE COUNTER MEDICATION Take 1 capsule by mouth 2 (two) times a week. Coenzyme Q-10 200 mg w/Vitamin D 200 mg    [provider]  polyethylene glycol powder (GLYCOLAX/MIRALAX) powder Take 17 g by mouth 2 (two) times a week.    [provider]  Polyvinyl Alcohol-Povidone PF (REFRESH) 1.4-0.6 % SOLN Place 1 drop into both eyes daily as needed (dry eyes).     [provider]  Probiotic Product (PHILLIPS COLON HEALTH) CAPS Take 1 capsule by mouth 2 (two) times a week.    [provider]  rosuvastatin (CRESTOR) 5 MG tablet Take 5 mg by mouth 2 (two) times a week.     [provider]    Allergies    Latex, Codeine, and Plasticized base [plastibase]  Review of Systems   Review of Systems  Gastrointestinal: Positive for nausea.  All other systems reviewed and are negative.   Physical Exam Updated Vital Signs BP (!) 152/74 (BP Location: Right Arm)   Pulse 82   Temp 98.1 F (36.7 C) (Oral)   Resp 18   Ht 5\' 3"  (1.6 m)   Wt 79.8 kg   SpO2 97%   BMI 31.18 kg/m   Physical Exam Vitals and nursing note reviewed.  Constitutional:      Appearance: She is well-developed.  HENT:     Head: Normocephalic and atraumatic.     Mouth/Throat:     Mouth: Mucous membranes are moist.     Pharynx: Oropharynx is clear.  Eyes:     Extraocular Movements: Extraocular movements intact.     Pupils: Pupils are equal, round, and reactive to light.  Cardiovascular:     Rate and Rhythm: Normal rate and regular rhythm.  Pulmonary:     Effort: Pulmonary effort is normal.     Breath sounds: Normal breath sounds.  Abdominal:     General: Abdomen is flat. Bowel sounds are normal.     Palpations: Abdomen is soft.     Tenderness: There is abdominal tenderness in the left upper quadrant.  Skin:    General: Skin is warm.     Capillary Refill: Capillary refill takes less than 2 seconds.  Neurological:      General: No focal deficit present.     Mental Status: She is alert and oriented to person, place, and time.  Psychiatric:        Mood and Affect: Mood normal.        Behavior: Behavior normal.     ED Results / Procedures / Treatments   Labs (all labs ordered are listed,  but only abnormal results are displayed) Labs Reviewed  COMPREHENSIVE METABOLIC PANEL - Abnormal; Notable for the following components:      Result Value   Glucose, Bld 114 (*)    All other components within normal limits  CBC WITH DIFFERENTIAL/PLATELET  LIPASE, BLOOD  URINALYSIS, ROUTINE W REFLEX MICROSCOPIC    EKG None  Radiology CT ABDOMEN PELVIS W CONTRAST  Result Date: 05/23/2019 CLINICAL DATA:  Fever, upper abdominal pain with nausea, history GERD, cholecystectomy, hysterectomy EXAM: CT ABDOMEN AND PELVIS WITH CONTRAST TECHNIQUE: Multidetector CT imaging of the abdomen and pelvis was performed using the standard protocol following bolus administration of intravenous contrast. Sagittal and coronal MPR images reconstructed from axial data set. CONTRAST:  100mL OMNIPAQUE IOHEXOL 300 MG/ML SOLN IV. No oral contrast. COMPARISON:  12/15/2017 FINDINGS: Lower chest: Lung bases clear Hepatobiliary: Gallbladder surgically absent. Liver normal appearance Pancreas: Normal appearance Spleen: Normal appearance Adrenals/Urinary Tract: Adrenal glands, kidneys, ureters, and bladder normal appearance Stomach/Bowel: Appendix not visualized. Stomach and bowel loops normal appearance Vascular/Lymphatic: Atherosclerotic calcifications aorta and iliac arteries without aneurysm. No adenopathy. Reproductive: Uterus surgically absent. Nonvisualization of ovaries. Other: No free air or free fluid. No inflammatory process. Musculoskeletal: Diffuse osseous demineralization. 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 IMPRESSION: No acute intra-abdominal or intrapelvic abnormalities. Aortic Atherosclerosis (ICD10-I70.0). Electronically  Signed   By: Ulyses SouthwardMark  Boles M.D.   On: 05/23/2019 09:12    Procedures Procedures (including critical care time)  Medications Ordered in ED Medications  ondansetron (ZOFRAN) injection 4 mg (4 mg Intravenous Given 05/23/19 0851)  sodium chloride 0.9 % bolus 1,000 mL (1,000 mLs Intravenous New Bag/Given 05/23/19 0850)  iohexol (OMNIPAQUE) 300 MG/ML solution 100 mL (100 mLs Intravenous Contrast Given 05/23/19 [id number])    ED Course  I have reviewed the triage vital signs and the nursing notes.  Pertinent labs & imaging results that were available during my care of the patient were reviewed by me and considered in my medical decision making (see chart for details).    MDM Rules/Calculators/A&P     CHA2DS2/VAS Stroke Risk Points      N/A >= 2 Points: High Risk  1 - 1.99 Points: Medium Risk  0 Points: Low Risk    A final score could not be computed because of missing components.: Last  Change: N/A     This score determines the patient's risk of having a stroke if the  patient has atrial fibrillation.      This score is not applicable to this patient. Components are not  calculated.                   Pt is feeling better.  Work up is reassuring.  Pt is stable for d/c. Return if worse. Final Clinical Impression(s) / ED Diagnoses Final diagnoses:  Left upper quadrant abdominal pain    Rx / DC Orders ED Discharge Orders         Ordered    ondansetron (ZOFRAN ODT) 4 MG disintegrating tablet  Every 8 hours PRN     05/23/19 [id number]           Jacalyn LefevreHaviland, Cristine Daw, MD 05/23/19 [phone]

## 2020-06-20 IMAGING — DX DG KNEE 1-2V PORT*R*
2 series · 2 of 2 positions shown · non-contrast
Comparison: None.

CLINICAL DATA: Status post partial right knee arthroplasty today.

EXAM:
PORTABLE RIGHT KNEE - 1-2 VIEW

[knee ap]
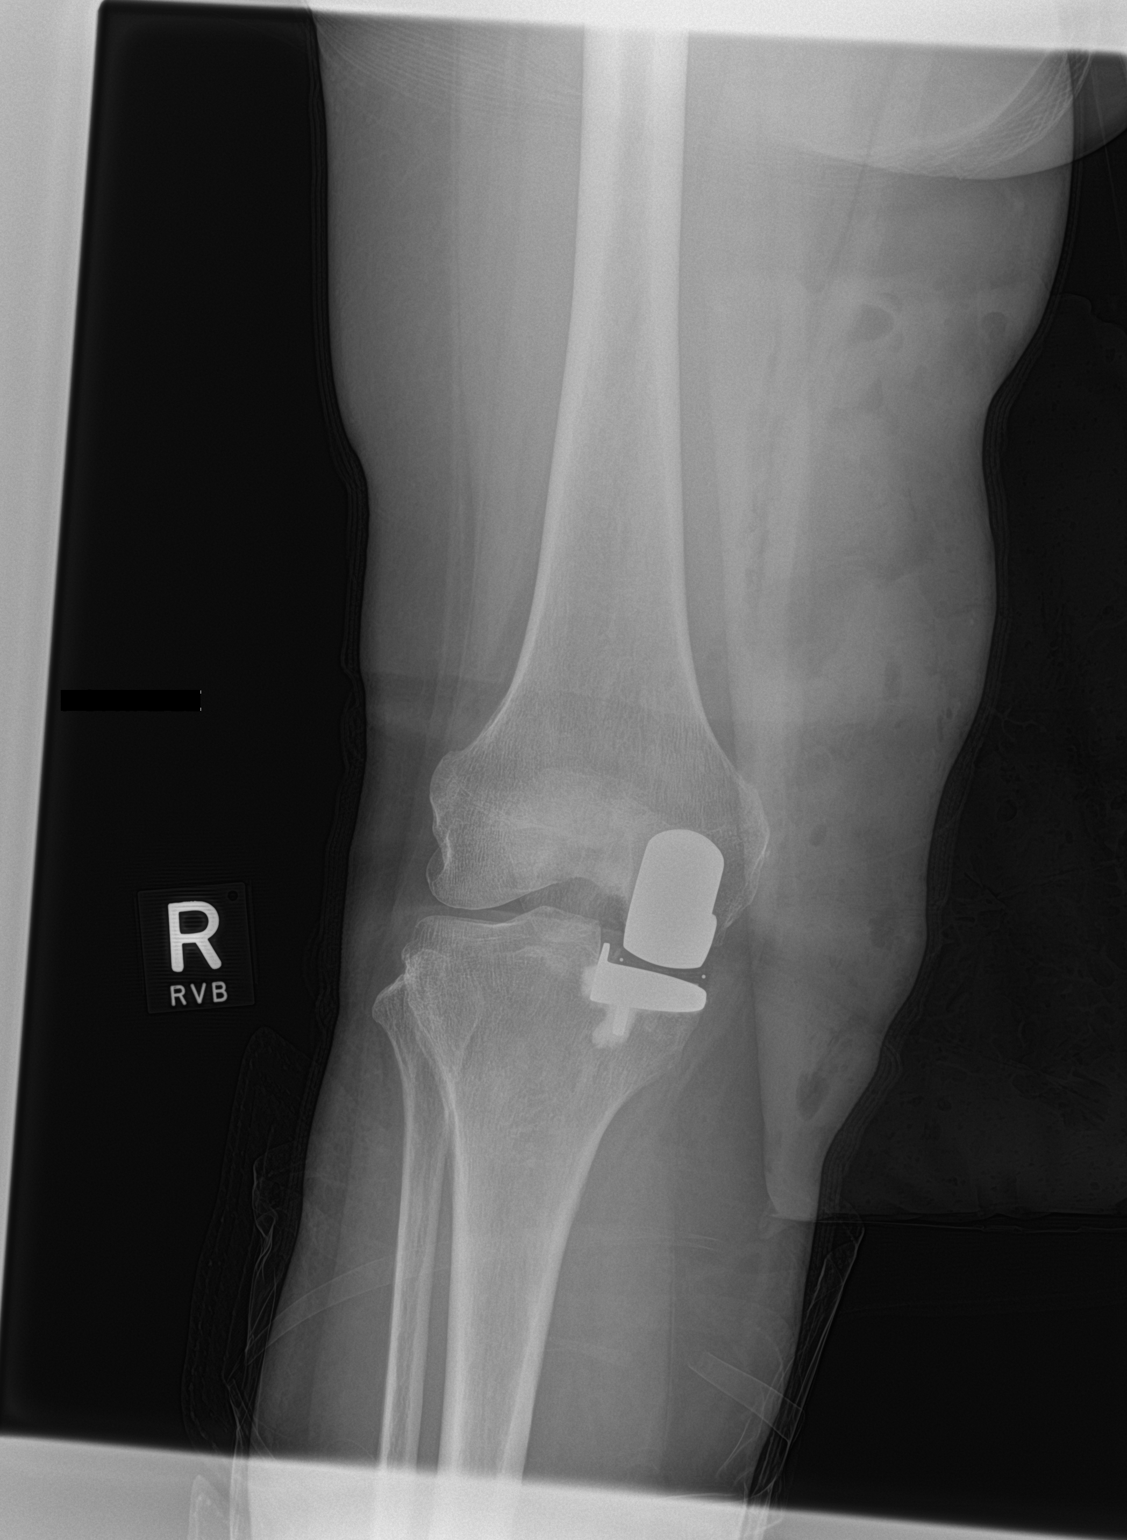

[knee lat]
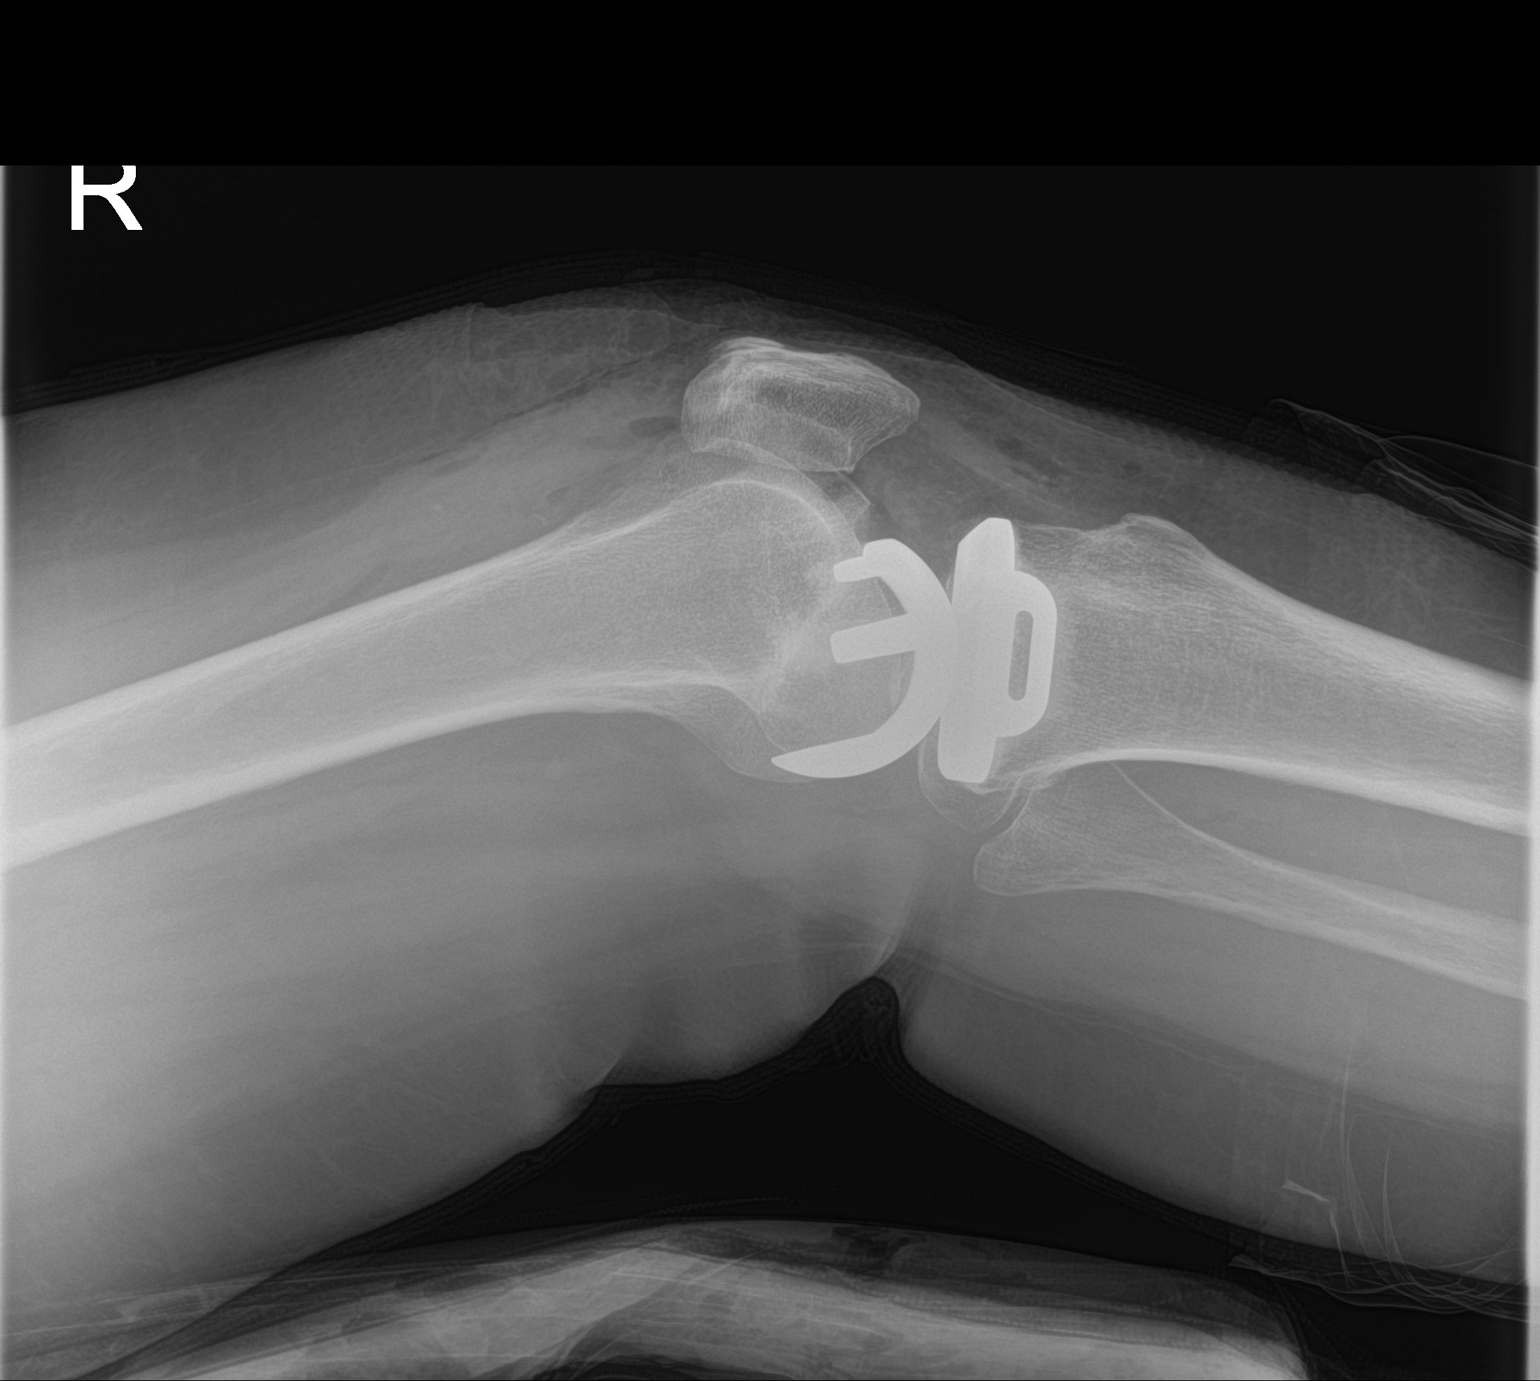

[2 of 2 positions shown; findings below may reference images not displayed]

FINDINGS: The patient is status post medial compartment arthroplasty of the
right knee. Hardware is intact and appears appropriately positioned.
No acute abnormality is identified. Gas in the soft tissues from
surgery noted.
IMPRESSION: Status post medial compartment arthroplasty right knee. No acute
finding.

## 2021-02-09 ENCOUNTER — Encounter: Payer: Self-pay | Admitting: Physician Assistant

## 2021-02-21 ENCOUNTER — Emergency Department (HOSPITAL_BASED_OUTPATIENT_CLINIC_OR_DEPARTMENT_OTHER)
Admission: EM | Admit: 2021-02-21 | Discharge: 2021-02-21 | Disposition: A | Discharge status: Home / Self Care | Payer: Medicare Other | Attending: Emergency Medicine | Admitting: Emergency Medicine

## 2021-02-21 ENCOUNTER — Encounter (HOSPITAL_BASED_OUTPATIENT_CLINIC_OR_DEPARTMENT_OTHER): Payer: Self-pay

## 2021-02-21 ENCOUNTER — Emergency Department (HOSPITAL_BASED_OUTPATIENT_CLINIC_OR_DEPARTMENT_OTHER): Payer: Medicare Other

## 2021-02-21 ENCOUNTER — Other Ambulatory Visit: Payer: Self-pay

## 2021-02-21 DIAGNOSIS — R519 Headache, unspecified: Secondary | ICD-10-CM | POA: Insufficient documentation

## 2021-02-21 DIAGNOSIS — R42 Dizziness and giddiness: Secondary | ICD-10-CM | POA: Insufficient documentation

## 2021-02-21 DIAGNOSIS — M542 Cervicalgia: Secondary | ICD-10-CM | POA: Insufficient documentation

## 2021-02-21 DIAGNOSIS — Z96651 Presence of right artificial knee joint: Secondary | ICD-10-CM | POA: Diagnosis not present

## 2021-02-21 LAB — CBC
HCT: 38.5 % (ref 36.0–46.0)
Hemoglobin: 12.6 g/dL (ref 12.0–15.0)
MCH: 30.1 pg (ref 26.0–34.0)
MCHC: 32.7 g/dL (ref 30.0–36.0)
MCV: 91.9 fL (ref 80.0–100.0)
Platelets: 240 10*3/uL (ref 150–400)
RBC: 4.19 MIL/uL (ref 3.87–5.11)
RDW: 13.6 % (ref 11.5–15.5)
WBC: 4.4 10*3/uL (ref 4.0–10.5)
nRBC: 0 % (ref 0.0–0.2)

## 2021-02-21 LAB — BASIC METABOLIC PANEL
Anion gap: 8 (ref 5–15)
BUN: 12 mg/dL (ref 8–23)
CO2: 24 mmol/L (ref 22–32)
Calcium: 9 mg/dL (ref 8.9–10.3)
Chloride: 108 mmol/L (ref 98–111)
Creatinine, Ser: 0.76 mg/dL (ref 0.44–1.00)
GFR, Estimated: >60 mL/min (ref >60)
Glucose, Bld: 98 mg/dL (ref 70–99)
Potassium: 3.8 mmol/L (ref 3.5–5.1)
Sodium: 140 mmol/L (ref 135–145)

## 2021-02-21 LAB — CBG MONITORING, ED: Glucose-Capillary: 83 mg/dL (ref 70–99)

## 2021-02-21 MED ORDER — DIPHENHYDRAMINE HCL 50 MG/ML IJ SOLN
25.0000 mg | Freq: Once | INTRAMUSCULAR | Status: AC
  Administered 2021-02-21: 25 mg via INTRAVENOUS
  Filled 2021-02-21: qty 1

## 2021-02-21 MED ORDER — SODIUM CHLORIDE 0.9 % IV BOLUS
1000.0000 mL | Freq: Once | INTRAVENOUS | Status: AC
  Administered 2021-02-21: 1000 mL via INTRAVENOUS

## 2021-02-21 MED ORDER — ACETAMINOPHEN 500 MG PO TABS
1000.0000 mg | ORAL_TABLET | Freq: Once | ORAL | Status: AC
  Administered 2021-02-21: 1000 mg via ORAL
  Filled 2021-02-21: qty 2

## 2021-02-21 MED ORDER — PROCHLORPERAZINE EDISYLATE 10 MG/2ML IJ SOLN
10.0000 mg | Freq: Once | INTRAMUSCULAR | Status: AC
  Administered 2021-02-21: 10 mg via INTRAVENOUS
  Filled 2021-02-21: qty 2

## 2021-02-21 MED ORDER — IOHEXOL 350 MG/ML SOLN
100.0000 mL | Freq: Once | INTRAVENOUS | Status: AC | PRN
  Administered 2021-02-21: 100 mL via INTRAVENOUS

## 2021-02-21 NOTE — ED Provider Notes (Signed)
[facility]PT [facility] Emergency Department Provider Note MRN:  [id number]  Arrival date & time: 02/21/21     Chief Complaint   Headache History of Present Illness   Alicia Reyes is a 75 y.o. year-old female with a history of headache presenting to the ED with chief complaint of headache.  Location: Frontal Duration: Several hours Onset: Sudden Timing: Constant Description: Dull Severity: Severe Exacerbating/Alleviating Factors: None Associated Symptoms: Lightheadedness, neck pain Pertinent Negatives: No numbness or weakness to the arms or legs, no bowel or bladder dysfunction  Additional History: Struggling with headache and neck stiffness since sustaining trauma a few months ago.  Review of Systems  A complete 10 system review of systems was obtained and all systems are negative except as noted in the HPI and PMH.   Patient's Health History    Past Medical History:  Diagnosis Date   Anxiety    Arthritis    GERD (gastroesophageal reflux disease)    Headache    cluster headaches   Rosacea     Past Surgical History:  Procedure Laterality Date   ABDOMINAL HYSTERECTOMY     CARPAL TUNNEL RELEASE     left hand   CATARACT EXTRACTION, BILATERAL     with lens implant   CHOLECYSTECTOMY     DILATION AND CURETTAGE OF UTERUS     EYE SURGERY     retina surgery -bilateral   FOOT SURGERY     bilateral feet   PARTIAL KNEE ARTHROPLASTY Right 06/25/2018   Procedure: UNICOMPARTMENTAL KNEE;  Surgeon: Sheral Apley, MD;  Location: [facility];  Service: Orthopedics;  Laterality: Right;    No family history on file.  Social History   Socioeconomic History   Marital status: Married    Spouse name: Not on file   Number of children: Not on file   Years of education: Not on file   Highest education level: Not on file  Occupational History   Not on file  Tobacco Use   Smoking status: Never   Smokeless tobacco: Never  Vaping Use   Vaping Use: Never used   Substance and Sexual Activity   Alcohol use: Never   Drug use: Never   Sexual activity: Not on file  Other Topics Concern   Not on file  Social History Narrative   Not on file   Social Determinants of Health   Financial Resource Strain: Not on file  Food Insecurity: Not on file  Transportation Needs: Not on file  Physical Activity: Not on file  Stress: Not on file  Social Connections: Not on file  Intimate Partner Violence: Not on file     Physical Exam   Vitals:   02/21/21 0400 02/21/21 0430  BP: (!) 143/79 (!) 144/85  Pulse: 61 70  Resp: 12 11  Temp:    SpO2: 98% 98%    CONSTITUTIONAL: Well-appearing, NAD NEURO:  Alert and oriented x 3, normal and symmetric strength and sensation, normal coordination, normal speech EYES:  eyes equal and reactive ENT/NECK:  no LAD, no JVD CARDIO: Regular rate, well-perfused, normal S1 and S2 PULM:  CTAB no wheezing or rhonchi GI/GU:  normal bowel sounds, non-distended, non-tender MSK/SPINE:  No gross deformities, no edema SKIN:  no rash, atraumatic PSYCH:  Appropriate speech and behavior  *Additional and/or pertinent findings included in MDM below  Diagnostic and Interventional Summary    EKG Interpretation  Date/Time:  Monday February 21 2021 03:15:19 EDT Ventricular Rate:  67 PR Interval:  153  QRS Duration: 97 QT Interval:  391 QTC Calculation: 413 R Axis:   50 Text Interpretation: Sinus rhythm RSR' in V1 or V2, right VCD or RVH Confirmed by Kennis Carina [phone]) on 02/21/2021 3:42:37 AM       Labs Reviewed  BASIC METABOLIC PANEL  CBC  URINALYSIS, ROUTINE W REFLEX MICROSCOPIC  CBG MONITORING, ED    CT Angio Head W or Wo Contrast  Final Result    CT Angio Neck W and/or Wo Contrast  Final Result    CT C-SPINE NO CHARGE  Final Result      Medications  sodium chloride 0.9 % bolus 1,000 mL (0 mLs Intravenous Paused 02/21/21 0440)  prochlorperazine (COMPAZINE) injection 10 mg (10 mg Intravenous Given 02/21/21  0405)  diphenhydrAMINE (BENADRYL) injection 25 mg (25 mg Intravenous Given 02/21/21 0405)  acetaminophen (TYLENOL) tablet 1,000 mg (1,000 mg Oral Given 02/21/21 0402)  iohexol (OMNIPAQUE) 350 MG/ML injection 100 mL (100 mLs Intravenous Contrast Given 02/21/21 0457)     Procedures  /  Critical Care Procedures  ED Course and Medical Decision Making  I have reviewed the triage vital signs, the nursing notes, and pertinent available records from the EMR.  Listed above are laboratory and imaging tests that I personally ordered, reviewed, and interpreted and then considered in my medical decision making (see below for details).  Sudden onset headache, history of headaches but this is much different than her normal.  Will obtain CT imaging to exclude signs of subarachnoid hemorrhage.     CT scan is overall reassuring, patient feeling much better after migraine cocktail.  Labs reassuring, suspect an acute on chronic headache, long history of headaches.  Appropriate for discharge with follow-up with regular doctors.  Elmer Sow. Pilar Plate, MD [facility] Emergency Medicine [facility] [email or web address]  Final Clinical Impressions(s) / ED Diagnoses     ICD-10-CM   1. Acute nonintractable headache, unspecified headache type  R51.9     2. Neck pain  M54.2 CT C-SPINE NO CHARGE    CT C-SPINE NO CHARGE      ED Discharge Orders     None        Discharge Instructions Discussed with and Provided to Patient:     Discharge Instructions      You were evaluated in the Emergency Department and after careful evaluation, we did not find any emergent condition requiring admission or further testing in the hospital.  Your exam/testing today was overall reassuring.  Please return to the Emergency Department if you experience any worsening of your condition.  Thank you for allowing [place] to be a part of your care.         Sabas Sous, MD 02/21/21 [phone]

## 2021-02-21 NOTE — ED Triage Notes (Addendum)
Pt reports she woke up this morning around 0200 with headache, neck pain and dizziness.  HA has subsided to a 6/10 but she still feels dizzy. She states the neck and headache is because she had recently broken her right humerus a month ago.

## 2021-02-21 NOTE — Discharge Instructions (Addendum)
You were evaluated in the Emergency Department and after careful evaluation, we did not find any emergent condition requiring admission or further testing in the hospital.  Your exam/testing today was overall reassuring.  Please return to the Emergency Department if you experience any worsening of your condition.  Thank you for allowing us to be a part of your care.  

## 2021-02-21 NOTE — ED Notes (Signed)
Patient transported to CT 

## 2021-03-01 ENCOUNTER — Ambulatory Visit: Payer: Medicare Other | Admitting: Physician Assistant

## 2021-05-18 ENCOUNTER — Encounter: Payer: Self-pay | Admitting: Neurology

## 2021-07-24 NOTE — Progress Notes (Signed)
NEUROLOGY CONSULTATION NOTE  Alicia Reyes MRN: [id number] DOB: 07-03-1945  Referring provider: Egbert Garibaldi, PA-C Primary care provider: Egbert Garibaldi, PA-C  Reason for consult:  headache  Assessment/Plan:   Chronic tension-type headaches, aggravated by concussion and medication-overuse Episode of fall - cannot remember falling but that may be due to concussion but will evaluate for any possible epileptogenic potential. Memory deficits/tremulous - memory problems may possibly be post-concussion but I think there is an anxiety component   Subjective:  Alicia Reyes is a 76 year old female with HTN and history of cataract surgery who presents for headaches.  History supplemented by referring provider's note.  She is with her husband who supplements history.  Headaches since highschool.  It is a severe pressure/throbbing, location varies (more recently back of head and neck.  Associated photophobia and right frontal swelling but no nausea, vomiting, phonophobia, visual disturbance, numbness or weakness. Last up to 3 to 4 hours.  Occurs once every 2-3 days  She had a fall in June 2022.  She was having dizzy spells off an on.  She got up early morning and the next thing she knew she was on the ground.  CT head at that time showed no acute findings.  Headaches and dizziness became worse and more frequent.  She was seen in the ED on 02/21/2021 where CTA head and neck personally reviewed showed no vascular abnormality and CT cervical spine showed mild degenerative changes but otherwise unremarkable.    Headaches are now daily.  She treats with Tylenol everyday.  NSAIDs upsets her stomach.  Since the fall, she sustained memory loss.  For 4-5 days she was unable to recognize her husband or her home.  She still reports some mild short-term memory problems.  For well before the head injury, she had vision problems.  She needed cataract surgery which was not helpful.  She subsequently  underwent retinal surgery.  Following the retinal surgery she has intermittent binocular diplopia.  Eye specialists unable to figure out why.  For a couple of weeks after the head injury, she reported that her vision seemed clearer.    MRI of brain without contrast on 05/28/2021 showed minimal chronic small vessel ischemic changes within the periventricular white matter but no acute or remarkable findings.   Current NSAIDS/analgesics:  Tylenol Other acute management:  lorazepam 1mg  PRN headache/neck spasms and QHS) Current triptans:  none Current ergotamine:  none Current anti-emetic:  Zofran 4mg  Current muscle relaxants:  none Current Antihypertensive medications:  furosemide Current Antidepressant medications:  none Current Anticonvulsant medications:  none Current anti-CGRP:  none Current benzodiazepines:  lorazepam 1mg  QHS PRN Current Vitamins/Herbal/Supplements:  B complex, Ca-D Current Antihistamines/Decongestants:  Zyrtec Other therapy:  none Hormone/birth control:  none Other medications:  none  Past NSAIDS/analgesics:  NSAIDs/Excedrin (upsets her stomach) Past abortive triptans:  sumatriptan 50mg  Past abortive ergotamine:  none Past antihypertensive medications:  propranolol Past antidepressant medications:  venlafaxine Past anticonvulsant medications:  none Past anti-CGRP:  none Other past therapies:  none  Does not drink caffeine.         PAST MEDICAL HISTORY: Past Medical History:  Diagnosis Date   Anxiety    Arthritis    GERD (gastroesophageal reflux disease)    Headache    cluster headaches   Rosacea     PAST SURGICAL HISTORY: Past Surgical History:  Procedure Laterality Date   ABDOMINAL HYSTERECTOMY     CARPAL TUNNEL RELEASE     left hand   CATARACT  EXTRACTION, BILATERAL     with lens implant   CHOLECYSTECTOMY     DILATION AND CURETTAGE OF UTERUS     EYE SURGERY     retina surgery -bilateral   FOOT SURGERY     bilateral feet   PARTIAL KNEE  ARTHROPLASTY Right 06/25/2018   Procedure: UNICOMPARTMENTAL KNEE;  Surgeon: Renette Butters, MD;  Location: [facility];  Service: Orthopedics;  Laterality: Right;    MEDICATIONS: Current Outpatient Medications on File Prior to Visit  Medication Sig Dispense Refill   acyclovir (ZOVIRAX) 400 MG tablet Take 400 mg by mouth 3 (three) times daily as needed (outbreaks).     aspirin EC 81 MG tablet Take 1 tablet (81 mg total) by mouth 2 (two) times daily. For DVT prophylaxis for 30 days after surgery. 60 tablet 0   Calcium-Magnesium 500-250 MG TABS Take 2 capsules by mouth 2 (two) times a week.     cetirizine (ZYRTEC) 10 MG tablet Take 10 mg by mouth daily as needed for allergies.      docusate sodium (COLACE) 100 MG capsule Take 1 capsule (100 mg total) by mouth 2 (two) times daily. To prevent constipation while taking pain medication. 60 capsule 0   gabapentin (NEURONTIN) 300 MG capsule Take 1 capsule (300 mg total) by mouth 2 (two) times daily for 14 days. For 2 weeks post op for pain. 28 capsule 0   Glucosamine-Chondroit-Vit C-Mn (GLUCOSAMINE CHONDR 1500 COMPLX) CAPS Take 5 capsules by mouth 2 (two) times a week.     LORazepam (ATIVAN) 1 MG tablet Take 1 mg by mouth at bedtime as needed for anxiety or sleep.     Lutein 10 MG TABS Take 10 mg by mouth 2 (two) times a week.     methocarbamol (ROBAXIN) 500 MG tablet Take 1 tablet (500 mg total) by mouth every 8 (eight) hours as needed for muscle spasms. 40 tablet 0   omeprazole (PRILOSEC) 20 MG capsule Take 1 capsule (20 mg total) by mouth daily. 30 days for gastroprotection while taking NSAIDs. 30 capsule 0   ondansetron (ZOFRAN ODT) 4 MG disintegrating tablet Take 1 tablet (4 mg total) by mouth every 8 (eight) hours as needed. 10 tablet 0   ondansetron (ZOFRAN) 4 MG tablet Take 1 tablet (4 mg total) by mouth every 8 (eight) hours as needed for nausea or vomiting. 20 tablet 0   OVER THE COUNTER MEDICATION Take 1 capsule by mouth 2 (two) times a week.  Coenzyme Q-10 200 mg w/Vitamin D 200 mg     polyethylene glycol powder (GLYCOLAX/MIRALAX) powder Take 17 g by mouth 2 (two) times a week.     Polyvinyl Alcohol-Povidone PF (REFRESH) 1.4-0.6 % SOLN Place 1 drop into both eyes daily as needed (dry eyes).      Probiotic Product (Garner) CAPS Take 1 capsule by mouth 2 (two) times a week.     rosuvastatin (CRESTOR) 5 MG tablet Take 5 mg by mouth 2 (two) times a week.      No current facility-administered medications on file prior to visit.    ALLERGIES: Allergies  Allergen Reactions   Latex Itching and Hives   Codeine Itching   Plasticized Base [Plastibase] Itching    Redness of skin     FAMILY HISTORY: No family history on file.  Objective:  Blood pressure 128/71, pulse 71, height 5\' 3"  (1.6 m), weight 158 lb 3.2 oz (71.8 kg), SpO2 96 %. General: No acute distress.  Patient appears  well-groomed.   Head:  Normocephalic/atraumatic Eyes:  fundi examined but not visualized Neck: supple, no paraspinal tenderness, full range of motion Back: No paraspinal tenderness Heart: regular rate and rhythm Lungs: Clear to auscultation bilaterally. Vascular: No carotid bruits. Neurological Exam: Mental status: alert and oriented to person, place, and time, recent and remote memory intact, fund of knowledge intact, attention and concentration intact, speech fluent and not dysarthric, language intact. Cranial nerves: CN I: not tested CN II: pupils equal, round and reactive to light, visual fields intact CN III, IV, VI:  full range of motion, no nystagmus, no ptosis CN V: facial sensation intact. CN VII: upper and lower face symmetric CN VIII: hearing intact CN IX, X: gag intact, uvula midline CN XI: sternocleidomastoid and trapezius muscles intact CN XII: tongue midline Bulk & Tone: normal, no fasciculations. Motor:  muscle strength 5/5 throughout Sensation:  Pinprick, temperature and vibratory sensation intact. Deep Tendon  Reflexes:  2+ throughout,  toes downgoing.   Finger to nose testing:  Without dysmetria.   Heel to shin:  Without dysmetria.   Gait:  Cautious.  Romberg negative.    Thank you for allowing me to take part in the care of this patient.  Metta Clines, DO  CC: Egbert Garibaldi, PA-C

## 2021-07-25 ENCOUNTER — Encounter: Payer: Self-pay | Admitting: Neurology

## 2021-07-25 ENCOUNTER — Other Ambulatory Visit: Payer: Self-pay

## 2021-07-25 ENCOUNTER — Ambulatory Visit (INDEPENDENT_AMBULATORY_CARE_PROVIDER_SITE_OTHER): Payer: Medicare Other | Admitting: Neurology

## 2021-07-25 VITALS — BP 128/71 | HR 71 | Ht 63.0 in | Wt 158.2 lb

## 2021-07-25 DIAGNOSIS — R55 Syncope and collapse: Secondary | ICD-10-CM

## 2021-07-25 DIAGNOSIS — R519 Headache, unspecified: Secondary | ICD-10-CM

## 2021-07-25 MED ORDER — NORTRIPTYLINE HCL 10 MG PO CAPS: 10.0000 mg | ORAL_CAPSULE | Freq: Every day | ORAL | 5 refills | Status: DC

## 2021-07-25 NOTE — Patient Instructions (Addendum)
Start nortriptyline 10mg  at bedtime.  If no improvement in headaches in 4 weeks, contact me and we can increase dose Limit use of pain relievers (such as Tylenol) to no more than 2 days out of week to prevent risk of rebound or medication-overuse headache. Keep headache diary Will check an EEG for Syncope  Follow up 4 months.

## 2021-08-02 ENCOUNTER — Other Ambulatory Visit: Payer: Self-pay

## 2021-08-02 ENCOUNTER — Ambulatory Visit (INDEPENDENT_AMBULATORY_CARE_PROVIDER_SITE_OTHER): Payer: Medicare Other | Admitting: Neurology

## 2021-08-02 DIAGNOSIS — R55 Syncope and collapse: Secondary | ICD-10-CM | POA: Diagnosis not present

## 2021-08-03 NOTE — Procedures (Signed)
ELECTROENCEPHALOGRAM REPORT  Date of Study: 08/02/2021  Patient's Name: Alicia Reyes MRN: [id number] Date of Birth: [birth year]   Clinical History: 76 year old woman who sustained concussion from fall.  Did not remember falling.  Medications: ZOVIRAX 400 MG tablet aspirin EC 81 MG tablet Calcium-Magnesium 500-250 MG TABS ZYRTEC 10 MG tablet COLACE 100 MG capsule NEURONTIN 300 MG capsule GLUCOSAMINE CHONDR 1500 COMPLX CAPS ATIVAN 1 MG tablet Lutein 10 MG TABS ROBAXIN 500 MG tablet PRILOSEC 20 MG capsule ZOFRAN ODT 4 MG disintegrating tablet Coenzyme Q-10 200 mg w/Vitamin D 200 mg GLYCOLAX/MIRALAX powder REFRESH 1.4-0.6 % SOLN [employer] CAPS CRESTOR 5 MG tablet  Technical Summary: A multichannel digital EEG recording measured by the international 10-20 system with electrodes applied with paste and impedances below 5000 ohms performed in our laboratory with EKG monitoring in an awake and drowsy patient.  Photic stimulation was performed.  The digital EEG was referentially recorded, reformatted, and digitally filtered in a variety of bipolar and referential montages for optimal display.    Description: The patient is awake and drowsy during the recording.  During maximal wakefulness, there is a symmetric, medium voltage 8 Hz posterior dominant rhythm that attenuates with eye opening.  The record is symmetric.  During drowsiness, there is an increase in theta slowing of the background.  Stage 2 sleep was not seen.  Photic stimulation did not elicit any abnormalities.  There were no epileptiform discharges or electrographic seizures seen.    EKG lead was unremarkable.  Impression: This awake and drowsy EEG is normal.    Clinical Correlation: A normal EEG does not exclude a clinical diagnosis of epilepsy.  If further clinical questions remain, prolonged EEG may be helpful.  Clinical correlation is advised.   Shon Millet, DO

## 2021-08-03 NOTE — Progress Notes (Signed)
Tried calling pt, Unable to get patient on the phone

## 2021-08-04 ENCOUNTER — Telehealth: Payer: Self-pay | Admitting: Neurology

## 2021-08-04 NOTE — Telephone Encounter (Signed)
Patient returned call for EEG results. 

## 2021-08-04 NOTE — Progress Notes (Signed)
LMOVM for pt please call the office back.

## 2021-08-16 ENCOUNTER — Other Ambulatory Visit: Payer: Self-pay | Admitting: Neurology

## 2021-12-02 ENCOUNTER — Ambulatory Visit: Payer: Medicare Other | Admitting: Neurology

## 2023-02-17 IMAGING — CT CT CERVICAL SPINE W/O CM
3 of 5 series · 12 of 33 positions shown, 14 images · IV contrast (Omnipaque)
Comparison: CTA head and neck today reported separately.

CLINICAL DATA: 75-year-old female woke at 6366 hours with headache,
neck pain and dizziness.

EXAM:
CT CERVICAL SPINE WITH CONTRAST
TECHNIQUE: Multiplanar CT images of the cervical spine were reconstructed from
contemporary CT of the Neck.
CONTRAST:  No additional

[Series 9: sagittal bone · sagittal · 0.29mm/px · 5 of 61 slices shown, 6 images]
[im 21/61  bone]
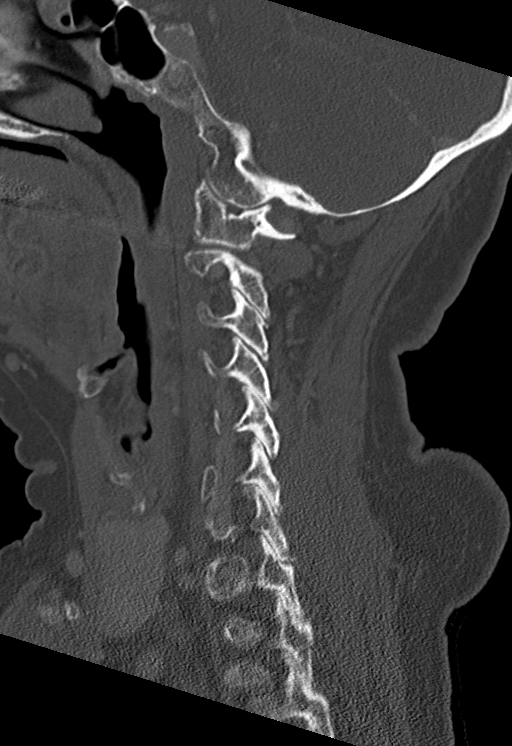
[im 26/61  bone]
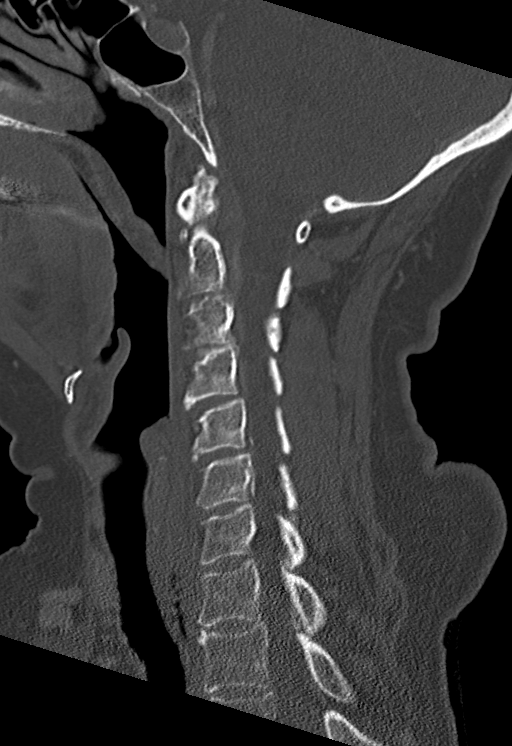
[im 31/61  soft-tissue]
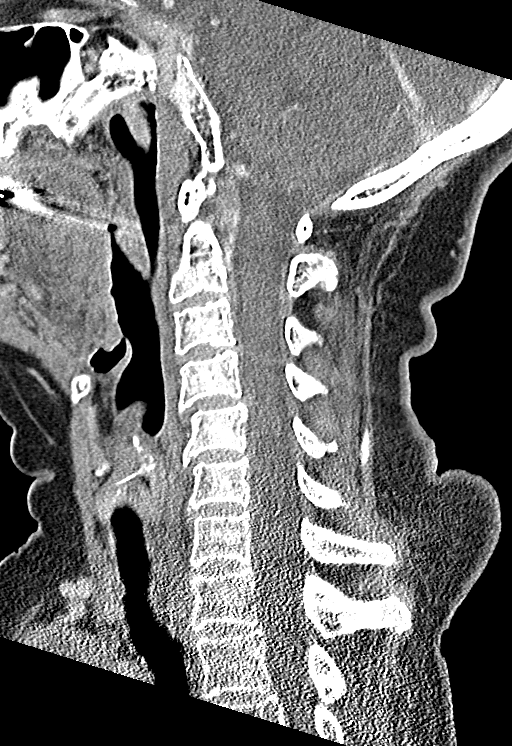
[im 31/61  bone]
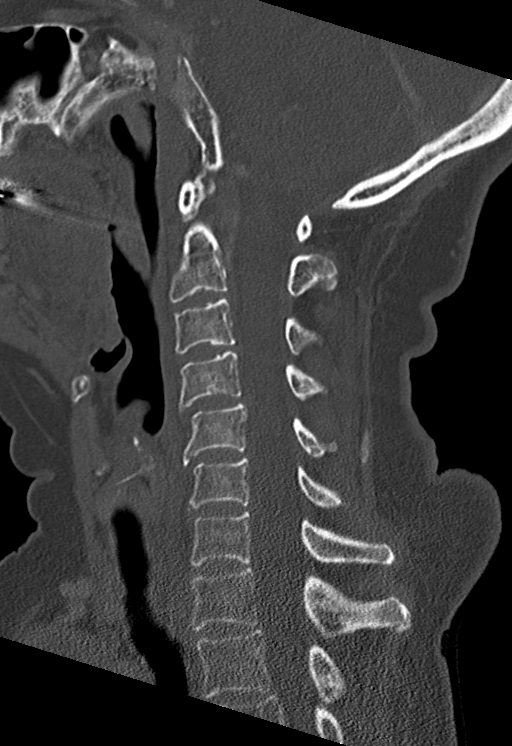
[im 36/61  bone]
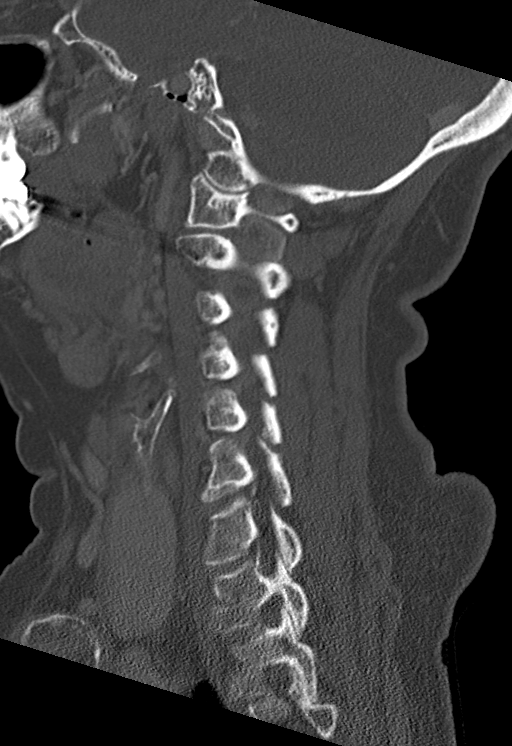
[im 41/61  bone]
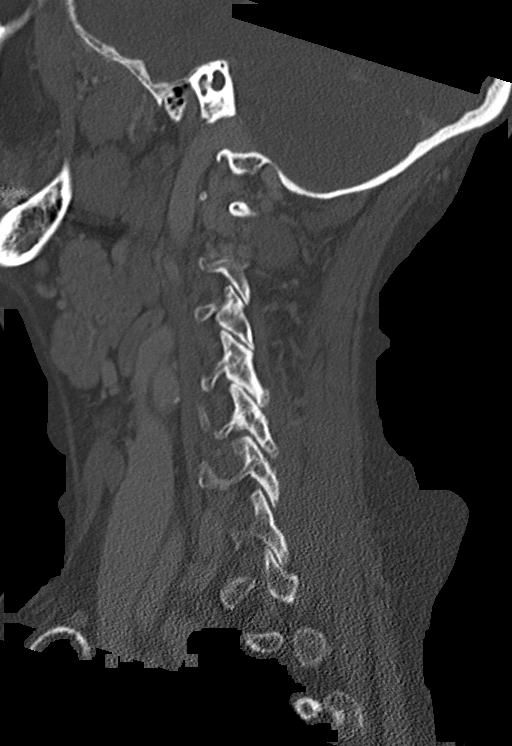

[Series 10: coronal bone · coronal · 0.29mm/px · 3 of 61 slices shown]
[im 13/61  bone]
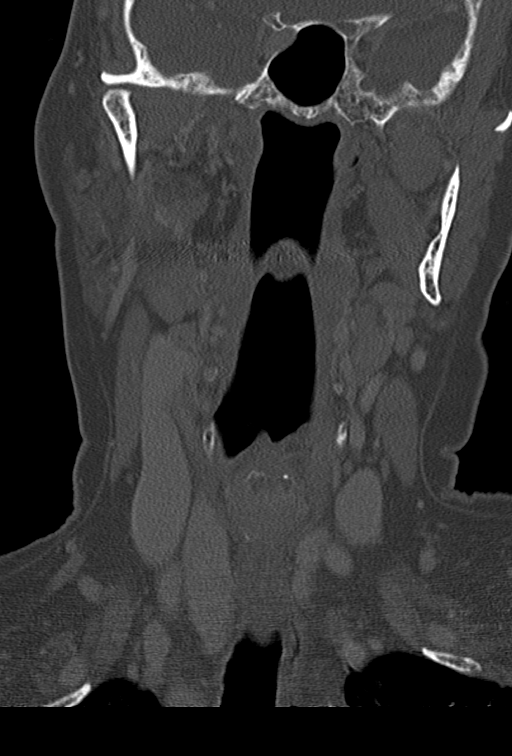
[im 25/61  bone]
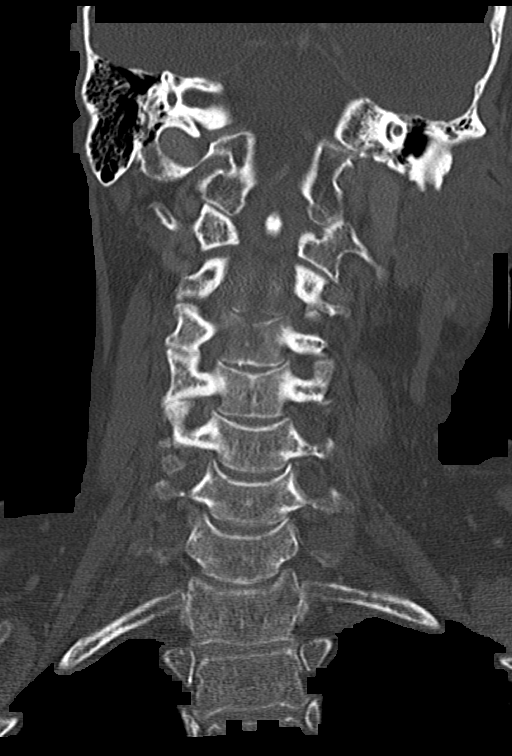
[im 37/61  bone]
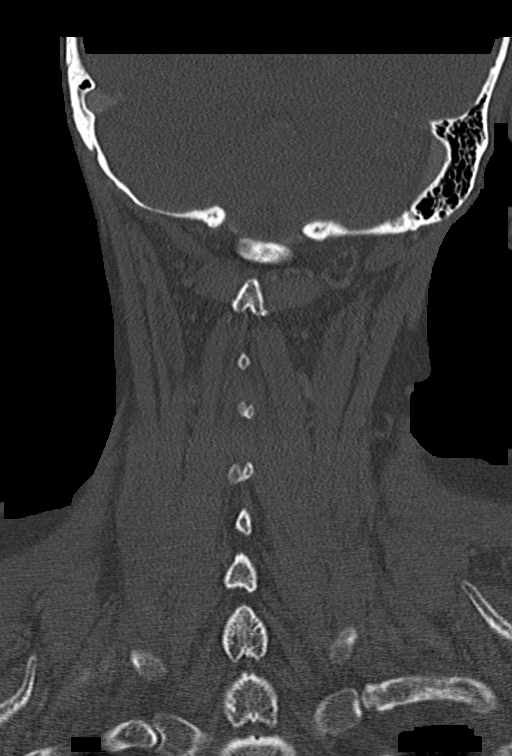

[Series 11: orthogonal bone · axial · 0.23mm/px · z∈[-340,-204]mm · 4 of 110 slices shown, 5 images]
[im 19/110  soft-tissue]
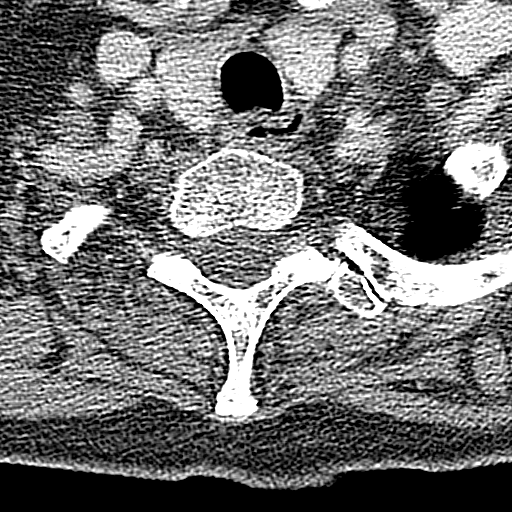
[im 19/110  bone]
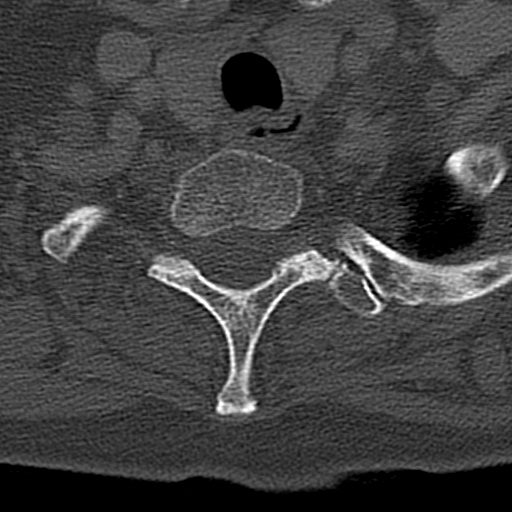
[im 37/110  bone]
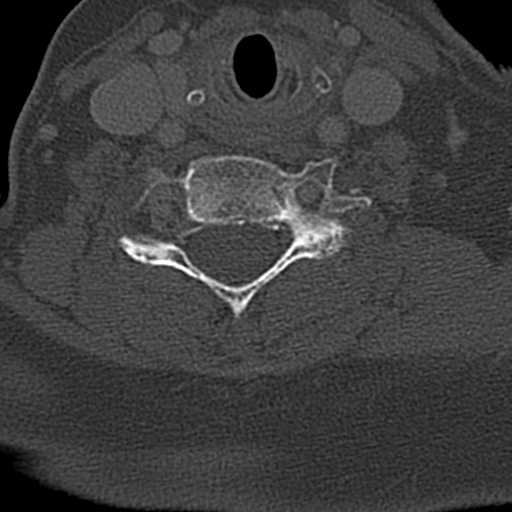
[im 73/110  bone]
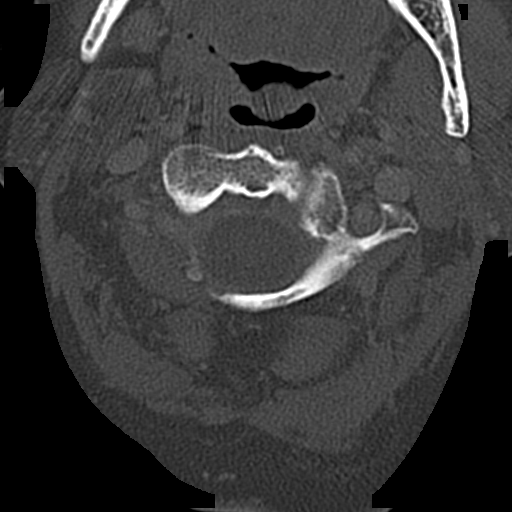
[im 91/110  bone]
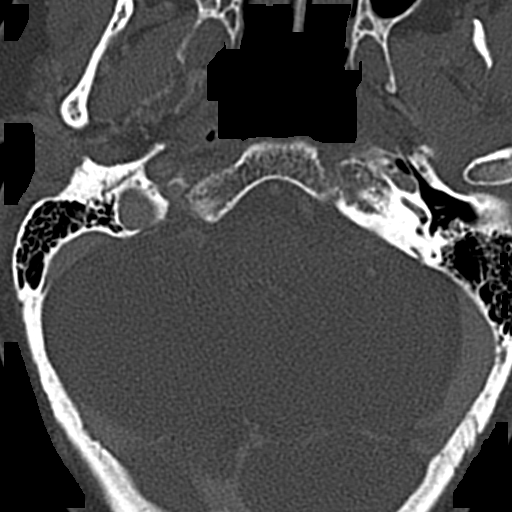

[12 of 33 positions shown; findings below may reference images not displayed]

FINDINGS: Alignment: Straightening of cervical lordosis. Cervicothoracic
junction alignment is within normal limits. No spondylolisthesis.
Bilateral posterior element alignment is within normal limits.

Skull base and vertebrae: Visualized skull base is intact. No
atlanto-occipital dissociation. No acute osseous abnormality
identified.

Soft tissues and spinal canal: No prevertebral fluid or swelling. No
visible canal hematoma. Neck soft tissue details are reported
separately.

Disc levels: Mild for age cervical spine degeneration. No CT
evidence of spinal stenosis.

Upper chest: Visible upper thoracic levels appear intact, negative.
IMPRESSION: 1. No acute osseous abnormality in the cervical spine. Mild for age
cervical spine degeneration.
2. CTA Head and Neck reported separately.
